# Patient Record
Sex: Female | Born: 1990 | Race: White | Hispanic: No | Smoking: Never smoker
Health system: Southern US, Community
[De-identification: ages and names within clinical notes are randomized; demographics above are authoritative.]

## PROBLEM LIST (undated history)

## (undated) DIAGNOSIS — J209 Acute bronchitis, unspecified: Secondary | ICD-10-CM

## (undated) DIAGNOSIS — F329 Major depressive disorder, single episode, unspecified: Secondary | ICD-10-CM

## (undated) DIAGNOSIS — F419 Anxiety disorder, unspecified: Secondary | ICD-10-CM

## (undated) DIAGNOSIS — F32A Depression, unspecified: Secondary | ICD-10-CM

## (undated) DIAGNOSIS — Z8489 Family history of other specified conditions: Secondary | ICD-10-CM

## (undated) DIAGNOSIS — Z189 Retained foreign body fragments, unspecified material: Secondary | ICD-10-CM

## (undated) DIAGNOSIS — K219 Gastro-esophageal reflux disease without esophagitis: Secondary | ICD-10-CM

## (undated) DIAGNOSIS — Z8041 Family history of malignant neoplasm of ovary: Secondary | ICD-10-CM

## (undated) DIAGNOSIS — R519 Headache, unspecified: Secondary | ICD-10-CM

## (undated) HISTORY — DX: Morbid (severe) obesity due to excess calories: E66.01

## (undated) HISTORY — DX: Family history of malignant neoplasm of ovary: Z80.41

## (undated) HISTORY — DX: Anxiety disorder, unspecified: F41.9

---
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
## 2011-07-11 ENCOUNTER — Emergency Department: Payer: Self-pay | Admitting: Emergency Medicine

## 2011-07-11 LAB — URINALYSIS, COMPLETE
Bacteria: NONE SEEN
Blood: NEGATIVE
Ketone: NEGATIVE
Nitrite: NEGATIVE
Ph: 5 (ref 4.5–8.0)
Protein: NEGATIVE
Specific Gravity: 1.016 (ref 1.003–1.030)
Squamous Epithelial: 6

## 2011-07-11 LAB — PREGNANCY, URINE: Pregnancy Test, Urine: NEGATIVE m[IU]/mL

## 2012-06-16 ENCOUNTER — Emergency Department: Payer: Self-pay | Admitting: Emergency Medicine

## 2012-06-16 LAB — PREGNANCY, URINE: Pregnancy Test, Urine: NEGATIVE m[IU]/mL

## 2012-06-16 LAB — CBC
MCH: 28.8 pg (ref 26.0–34.0)
MCHC: 33.9 g/dL (ref 32.0–36.0)
MCV: 85 fL (ref 80–100)
Platelet: 259 10*3/uL (ref 150–440)
RBC: 4.65 10*6/uL (ref 3.80–5.20)

## 2012-06-16 LAB — COMPREHENSIVE METABOLIC PANEL
Alkaline Phosphatase: 92 U/L (ref 50–136)
Creatinine: 0.78 mg/dL (ref 0.60–1.30)
EGFR (African American): >60
EGFR (Non-African Amer.): >60
Glucose: 89 mg/dL (ref 65–99)
Osmolality: 272 (ref 275–301)
Potassium: 3.7 mmol/L (ref 3.5–5.1)
SGOT(AST): 22 U/L (ref 15–37)
SGPT (ALT): 32 U/L (ref 12–78)
Sodium: 137 mmol/L (ref 136–145)
Total Protein: 8.2 g/dL (ref 6.4–8.2)

## 2012-06-16 LAB — URINALYSIS, COMPLETE
Bacteria: NONE SEEN
Bilirubin,UR: NEGATIVE
Ph: 6 (ref 4.5–8.0)
RBC,UR: 2 /HPF (ref 0–5)
Specific Gravity: 1.024 (ref 1.003–1.030)
Squamous Epithelial: 6

## 2012-06-16 LAB — LIPASE, BLOOD: Lipase: 161 U/L (ref 73–393)

## 2012-08-27 LAB — HM PAP SMEAR: HM PAP: NEGATIVE

## 2013-04-24 ENCOUNTER — Inpatient Hospital Stay: Payer: Self-pay | Admitting: Obstetrics and Gynecology

## 2013-04-24 LAB — CBC WITH DIFFERENTIAL/PLATELET
Basophil #: 0.1 10*3/uL (ref 0.0–0.1)
Basophil %: 0.2 %
EOS ABS: 0 10*3/uL (ref 0.0–0.7)
EOS PCT: 0.1 %
HCT: 39.1 % (ref 35.0–47.0)
HGB: 12.8 g/dL (ref 12.0–16.0)
LYMPHS ABS: 2.1 10*3/uL (ref 1.0–3.6)
Lymphocyte %: 9.3 %
MCH: 28 pg (ref 26.0–34.0)
MCHC: 32.7 g/dL (ref 32.0–36.0)
MCV: 86 fL (ref 80–100)
Monocyte #: 0.9 x10 3/mm (ref 0.2–0.9)
Monocyte %: 4 %
Neutrophil #: 19.2 10*3/uL — ABNORMAL HIGH (ref 1.4–6.5)
Neutrophil %: 86.4 %
Platelet: 277 10*3/uL (ref 150–440)
RBC: 4.57 10*6/uL (ref 3.80–5.20)
RDW: 14 % (ref 11.5–14.5)
WBC: 22.2 10*3/uL — AB (ref 3.6–11.0)

## 2013-04-25 LAB — HEMATOCRIT: HCT: 30.4 % — ABNORMAL LOW (ref 35.0–47.0)

## 2014-01-26 IMAGING — US ABDOMEN ULTRASOUND LIMITED
1 series · 14 of 25 positions shown · non-contrast
Comparison: none

REASON FOR EXAM: pain RUQ
COMMENTS:   Body Site: GB and Fossa, CBD, Head of Pancreas

PROCEDURE:     US  - US ABDOMEN LIMITED SURVEY  - June 17, 2012  [DATE]
RESULT:

[Series 1: abdomen ultrasound limited · 0.31mm/px · 14 of 52 slices shown]
[im 1/52]
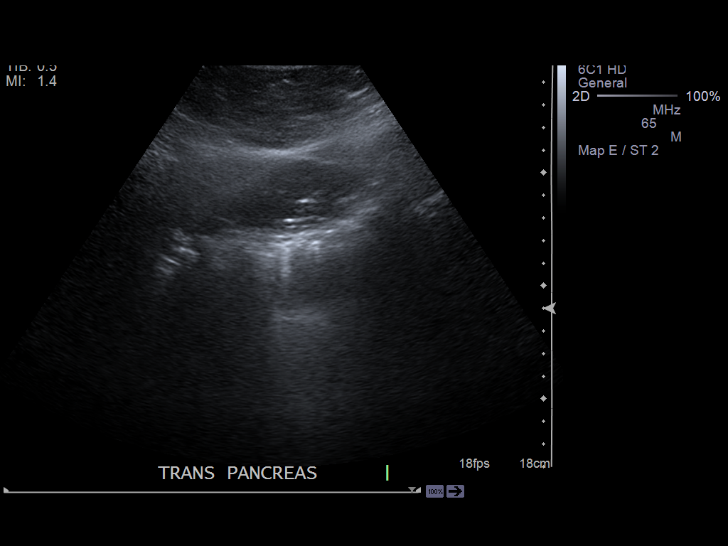
[im 5/52]
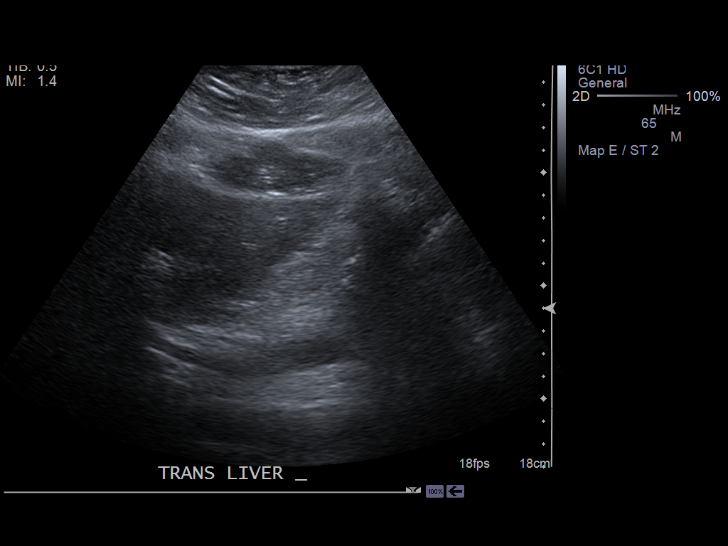
[im 9/52]
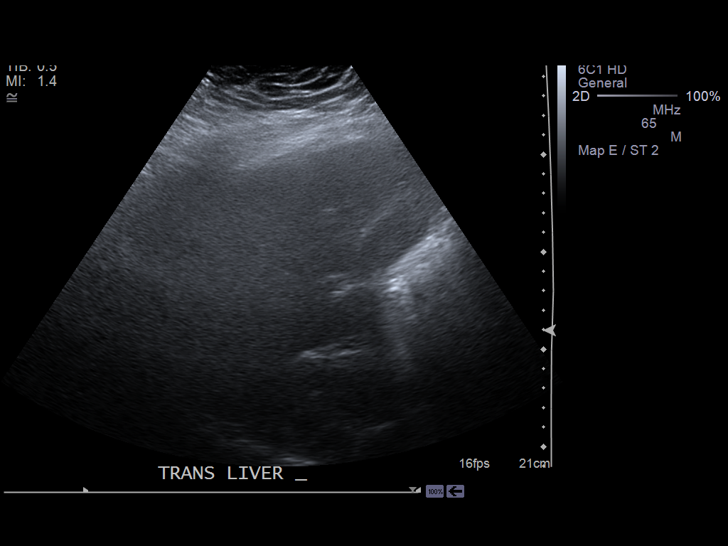
[im 13/52]
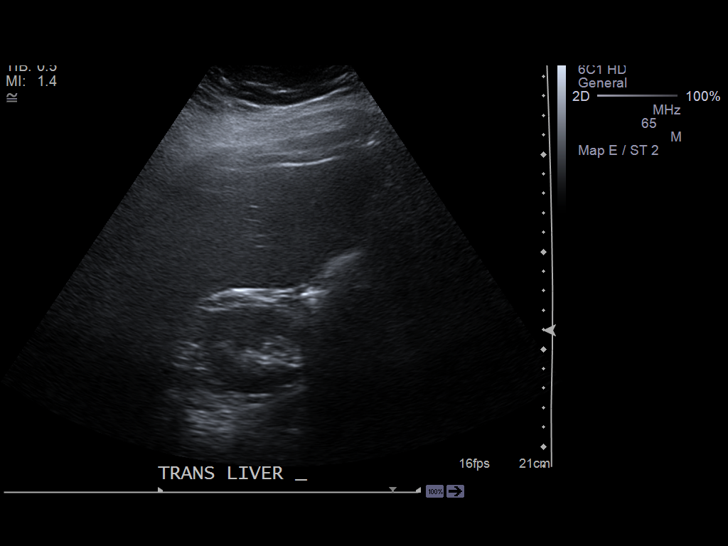
[im 18/52]
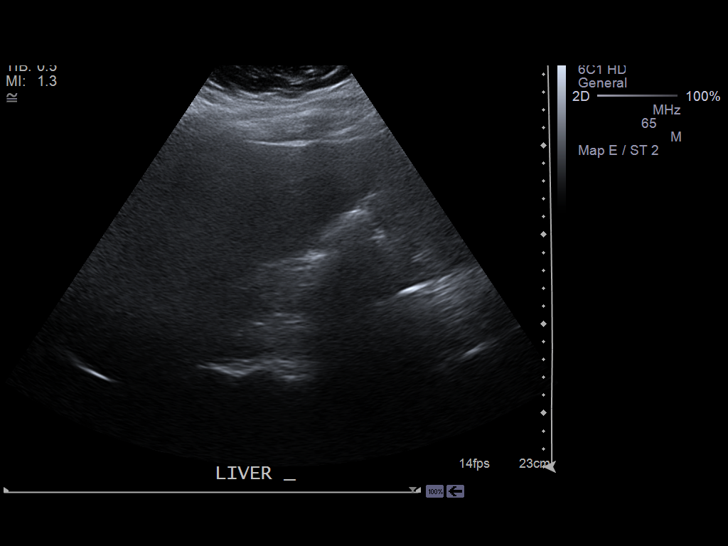
[im 20/52]
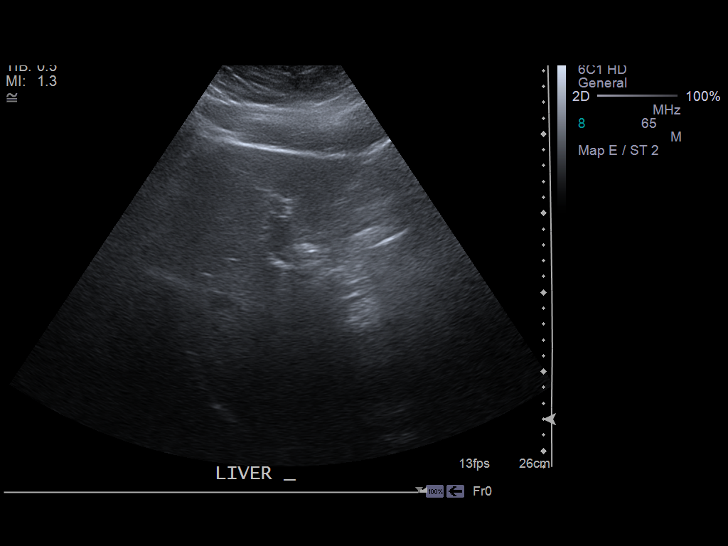
[im 24/52]
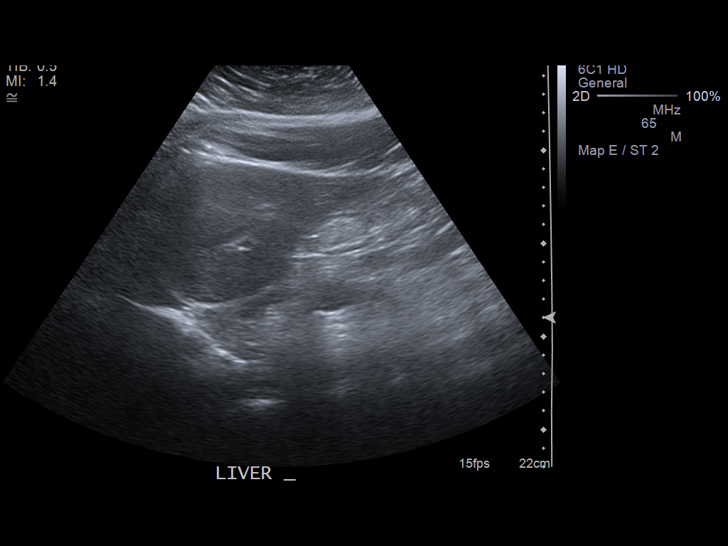
[im 28/52]
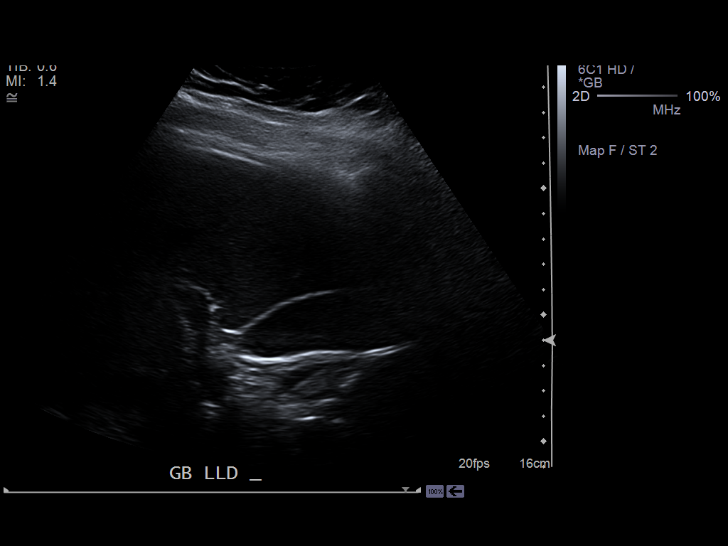
[im 32/52]
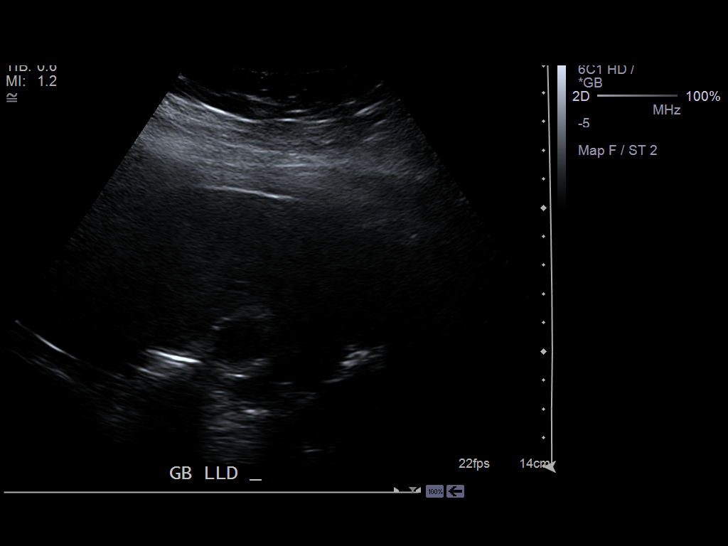
[im 35/52]
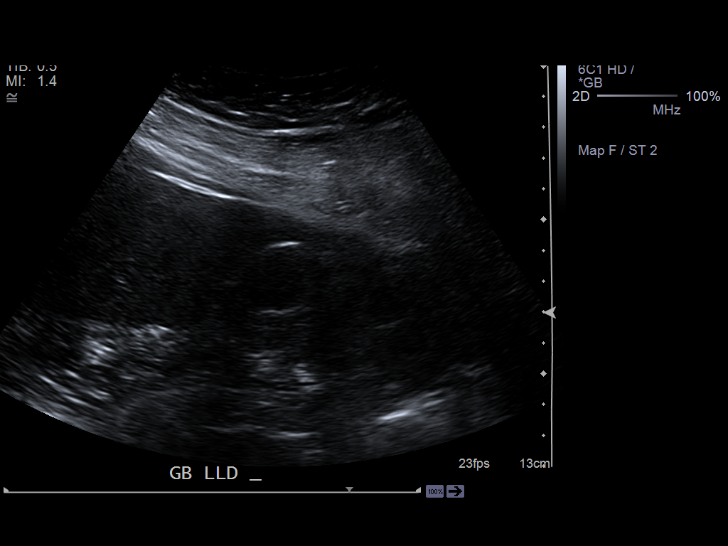
[im 39/52]
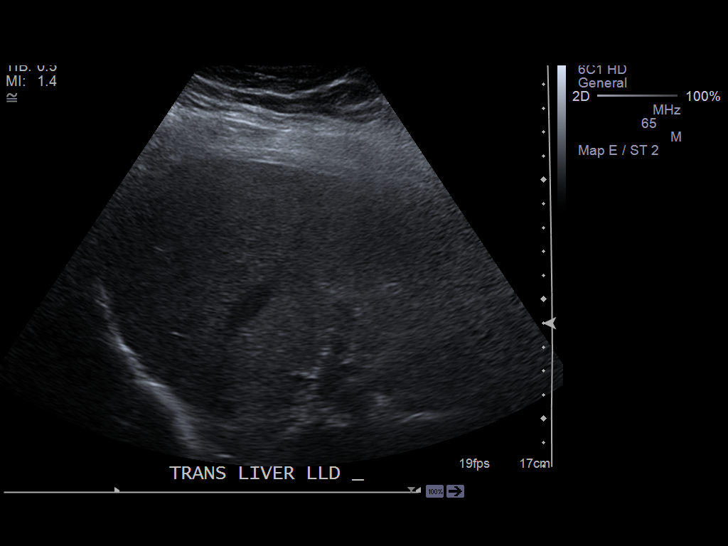
[im 43/52]
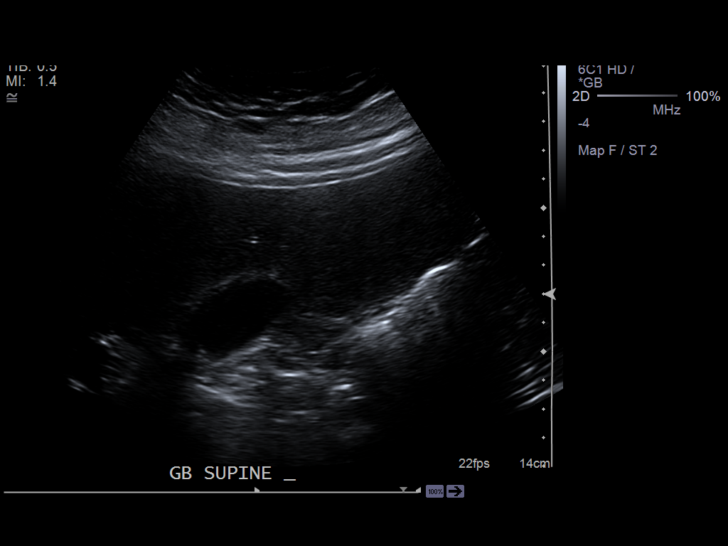
[im 47/52]
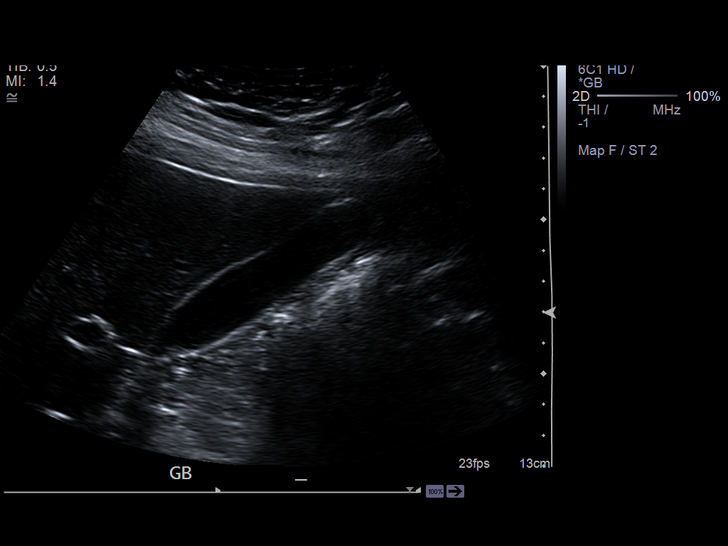
[im 52/52]
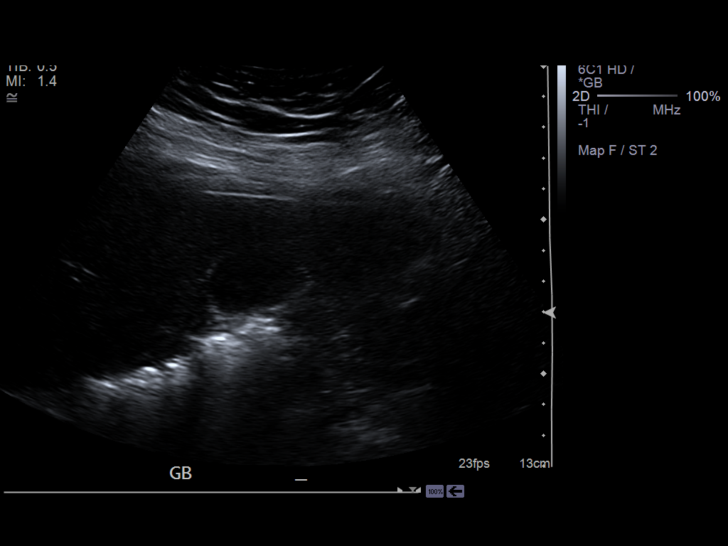

[14 of 25 positions shown; findings below may reference images not displayed]

FINDINGS: The liver is enlarged measuring 20.14 cm. There is no evidence of
intrahepatic or extrahepatic biliary ductal dilatation. Hepatobiliary flow
is identified within the portal vein. The pancreas is not visualized
secondary to a large amount of bowel gas.

There is no evidence of pericholecystic fluid, gallstones, sludging, or
sonographic Murphy's sign. Gallbladder wall thickness is 2 mm. The common
bile duct measures 2.7 mm in diameter.
IMPRESSION: 1.  Hepatomegaly.
2.  No sonographic evidence of cholecystitis
3.  The pancreas not visualized secondary to bowel gas.
4.  If there is persistent clinical concern, further evaluation with CT is
recommended.
5.  Dr. Ramel of the Emergency Department was informed of these findings
via a preliminary faxed report.

## 2014-01-26 IMAGING — CT CT ABD-PELV W/ CM
1 of 2 series · 15 of 32 positions shown, 19 images · non-contrast
Comparison: none

REASON FOR EXAM: (1) pain; (2) pain
COMMENTS:   May transport without cardiac monitor

PROCEDURE:     CT  - CT ABDOMEN / PELVIS  W  - June 17, 2012  [DATE]
RESULT:     Noncontrast CT abdomen and pelvis
TECHNIQUE: Helical noncontrasted 3 mm sections were obtained from the lung
bases through the pubic symphysis.

[Series 2: 3mm soft tissue · axial · 0.93mm/px · z∈[-1070,-564]mm · 15 of 187 slices shown, 19 images]
[im 9/187  soft-tissue]
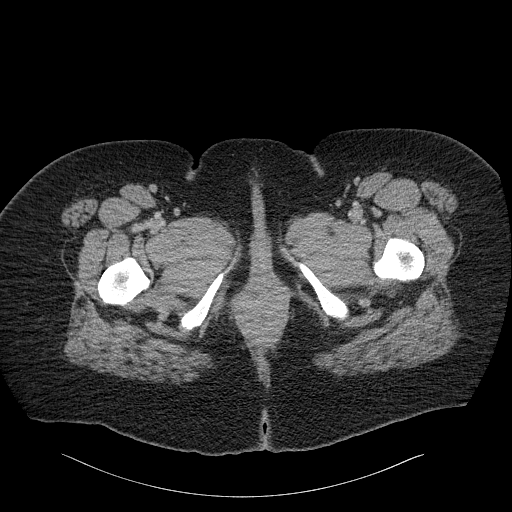
[im 9/187  bone]
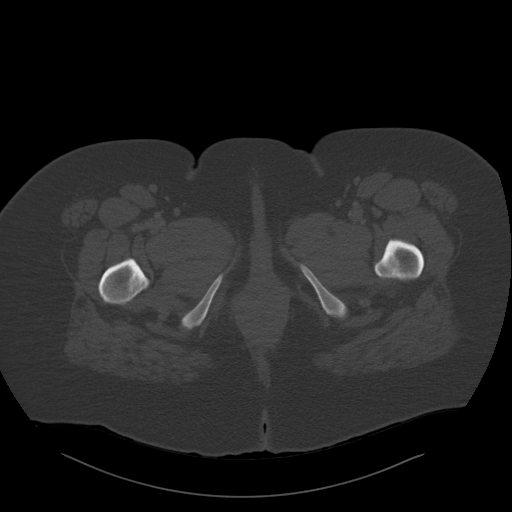
[im 25/187  soft-tissue]
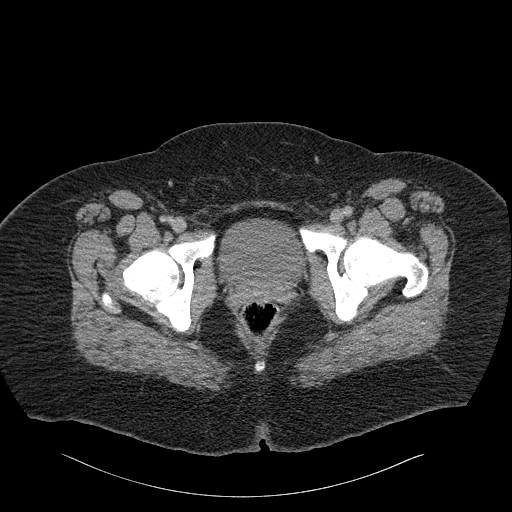
[im 41/187  soft-tissue]
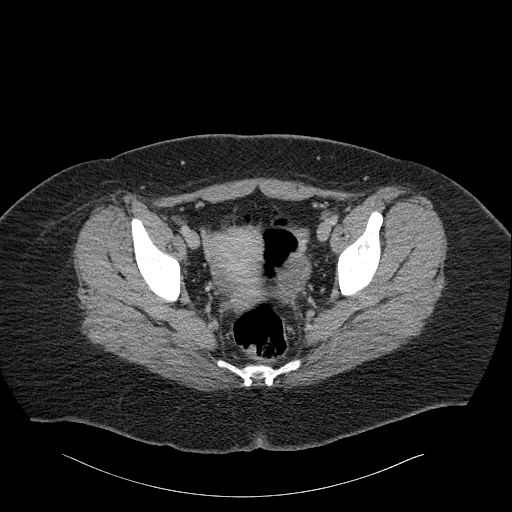
[im 49/187  soft-tissue]
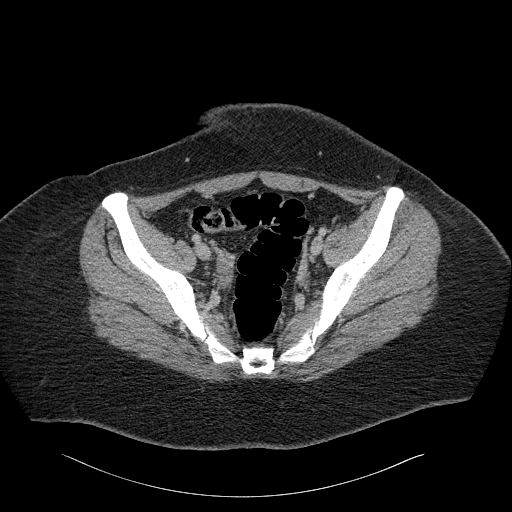
[im 65/187  soft-tissue]
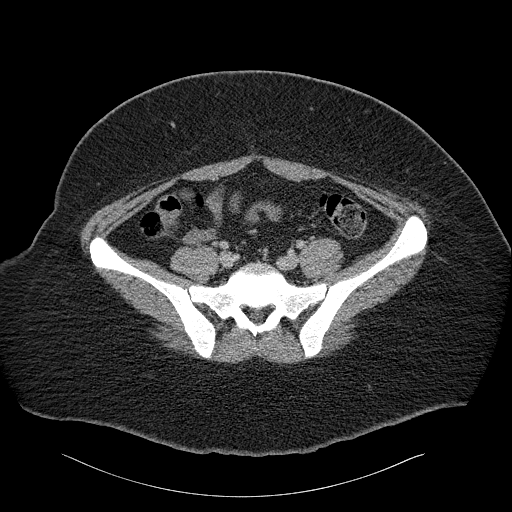
[im 81/187  soft-tissue]
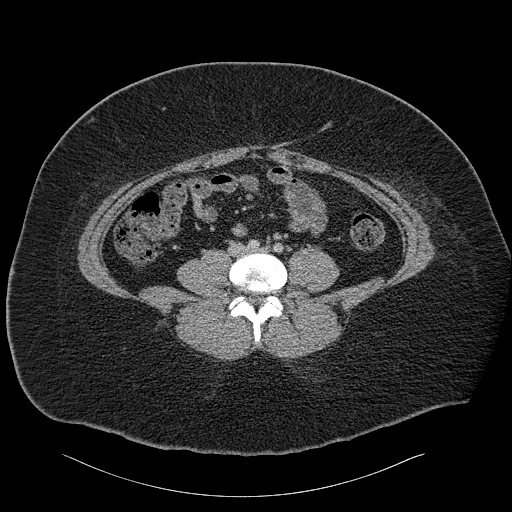
[im 98/187  soft-tissue]
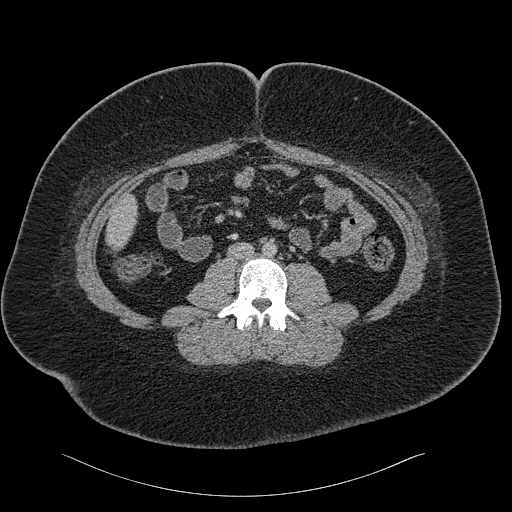
[im 106/187  soft-tissue]
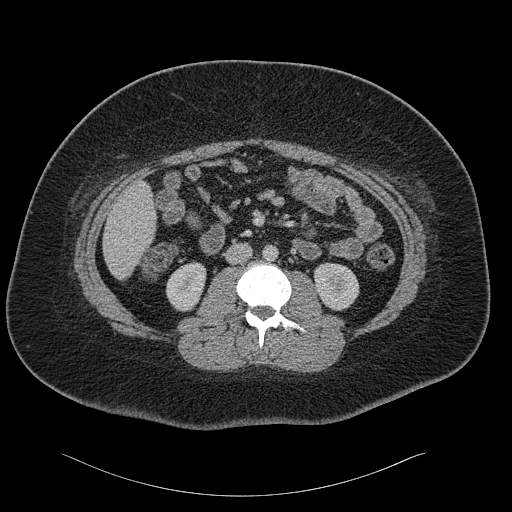
[im 122/187  soft-tissue]
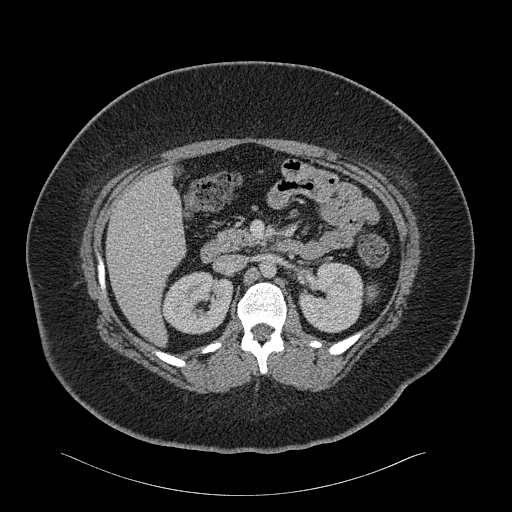
[im 122/187  bone]
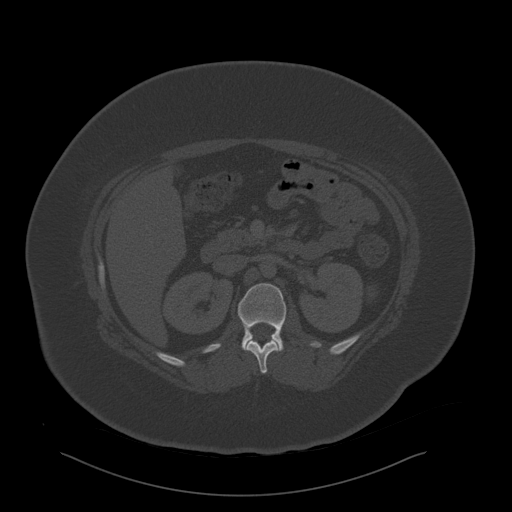
[im 138/187  soft-tissue]
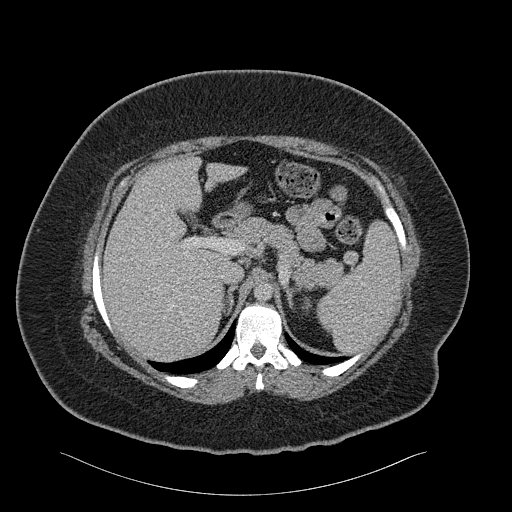
[im 146/187  soft-tissue]
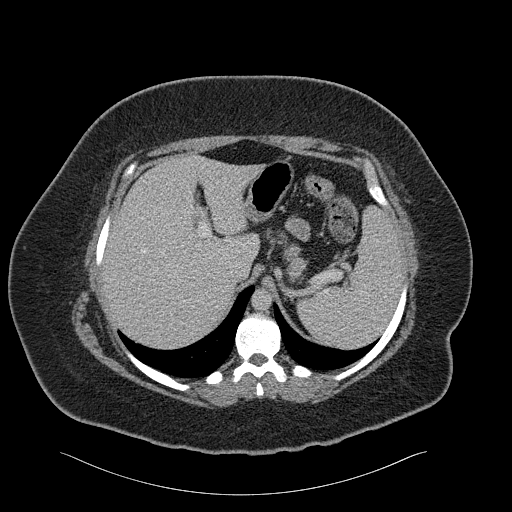
[im 154/187  lung]
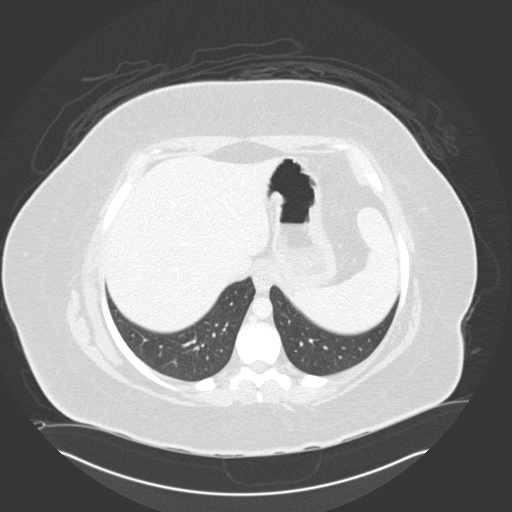
[im 162/187  soft-tissue]
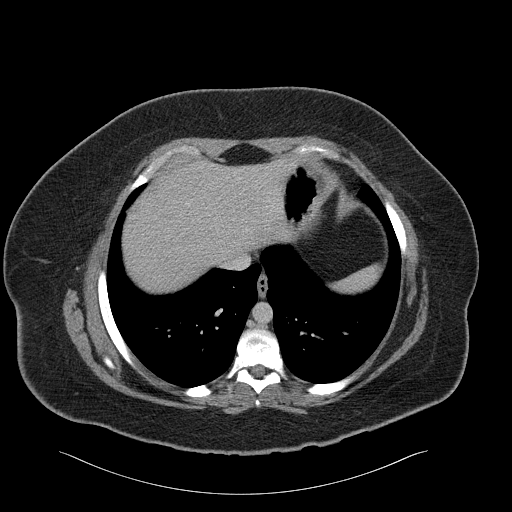
[im 162/187  lung]
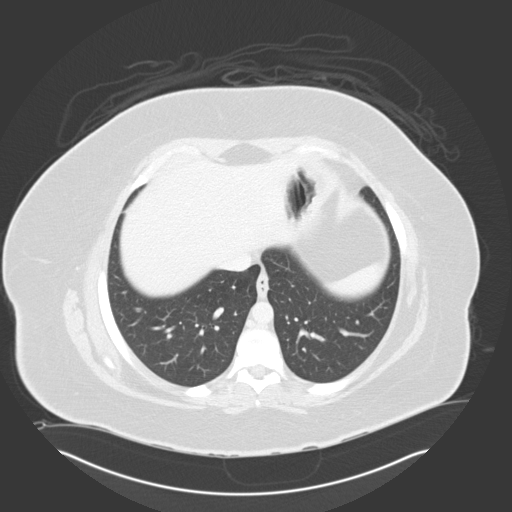
[im 170/187  lung]
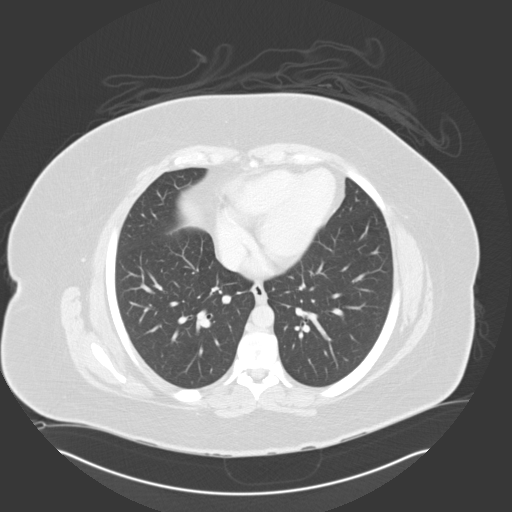
[im 178/187  soft-tissue]
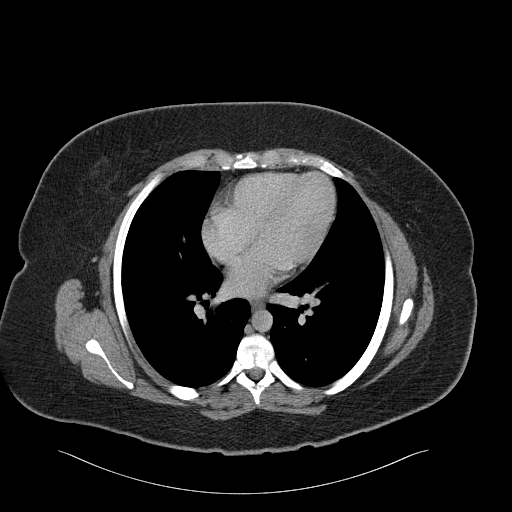
[im 178/187  lung]
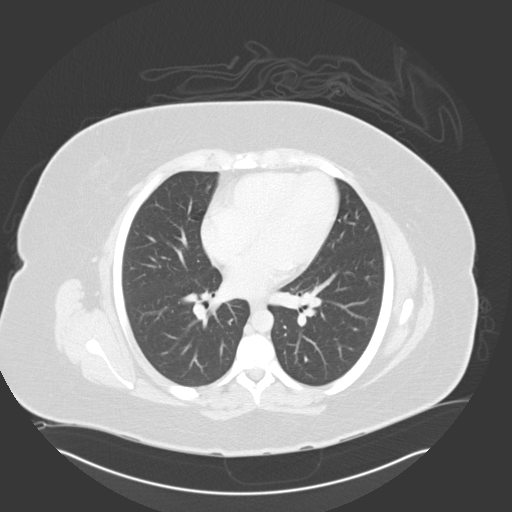

[15 of 32 positions shown; findings below may reference images not displayed]

FINDINGS: The lung bases are unremarkable.

Noncontrast evaluation of the liver, spleen, adrenals, pancreas, kidneys are
unremarkable. There is no CT evidence of bowel obstruction nor abdominal
aortic aneurysm. Within the limitations of a noncontrasted CT there is no
evidence of masses,free fluid, nor loculated fluid collections.

Prominent lymph nodes identified within the right lower quadrant.
IMPRESSION: 1. Prominent lymph nodes within the right lower quadrant which may represent
sequela of mesenteric adenitis. This is a diagnosis of exclusion. Clinical
correlation recommended.
2. Mild amount of fecal retention within the colon.
3. No further evidence of obstructive or inflammatory abnormalities.
4. Dr. Hasan M of the emergency department was informed of these findings via
a preliminary faxed report.

## 2014-06-01 NOTE — H&P (Signed)
L&D Evaluation:  History Expanded:  HPI 24 yo G1 at 35w1dby 6 week ultrasound whose pregnancy has been complicted by morbid obesity (BMI 46 at initial visit).  She presents with regular uterine contractions since yesterday evening.  She notes no vaginal bleeding, no leakage of fluid. She notes positive fetal movement.   Gravida 1   Blood Type (Maternal) B positive   Group B Strep Results Maternal (Result >5wks must be treated as unknown) negative  03/30/13   Maternal HIV Negative   Maternal Syphilis Ab Nonreactive   Maternal Varicella Non-Immune   Rubella Results (Maternal) nonimmune   [facility]   Patient's Medical History morbid obesity   Patient's Surgical History none   Medications Pre Natal Vitamins   Allergies NKDA   Social History none   Family History Non-Contributory   ROS:  ROS All systems were reviewed.  HEENT, CNS, GI, GU, Respiratory, CV, Renal and Musculoskeletal systems were found to be normal., unless noted in HPI   Exam:  Vital Signs T98.28F, P68, BP 118-148/74-78, RR 20   General no apparent distress, breathing through contractions   Mental Status clear   Chest clear   Heart normal sinus rhythm   Abdomen gravid, tender with contractions   Estimated Fetal Weight growth 86%ile at 36 weeks   Fetal Position cephalic   Back no CVAT   Edema no edema   Pelvic 5.5/100/-2 per RN   Mebranes Intact   FHT normal rate with no decels   FHT Description 120/mod var/+accels/no decels   Ucx 3-4 q 10 min   Skin no lesions   Impression:  Impression active labor, morbid obesity   Plan:  Comments 1) Labor: expectant management  2) Fetus - category I tracing  3) PNL B positive / ABSC negative / RNI - MMR postpartum / VZNI - Varicella vaccine postpartum / HIV neg / RPR NR / HBsAg neg / 1st trimester screen negative / 1-hr OGTT 86 / GBS negative  4) TDAP given 02/16/13  5) Disposition - home postpartum   Electronic  Signatures: JWill Bonnet(MD)  (Signed 03-Apr-15 07:00)  Authored: L&D Evaluation   Last Updated: 03-Apr-15 07:00 by JWill Bonnet(MD)

## 2016-07-27 ENCOUNTER — Encounter: Payer: Self-pay | Admitting: Advanced Practice Midwife

## 2016-07-31 ENCOUNTER — Ambulatory Visit (INDEPENDENT_AMBULATORY_CARE_PROVIDER_SITE_OTHER): Payer: Medicaid Other | Admitting: Advanced Practice Midwife

## 2016-07-31 ENCOUNTER — Encounter: Payer: Self-pay | Admitting: Advanced Practice Midwife

## 2016-07-31 VITALS — BP 110/76 | HR 69 | Ht 67.0 in | Wt 334.0 lb

## 2016-07-31 DIAGNOSIS — Z124 Encounter for screening for malignant neoplasm of cervix: Secondary | ICD-10-CM | POA: Diagnosis not present

## 2016-07-31 DIAGNOSIS — Z308 Encounter for other contraceptive management: Secondary | ICD-10-CM

## 2016-07-31 DIAGNOSIS — Z0000 Encounter for general adult medical examination without abnormal findings: Secondary | ICD-10-CM

## 2016-07-31 DIAGNOSIS — Z01419 Encounter for gynecological examination (general) (routine) without abnormal findings: Secondary | ICD-10-CM | POA: Diagnosis not present

## 2016-07-31 NOTE — Progress Notes (Signed)
Patient ID: Heather Irwin, female   DOB: 01/12/1991, 26 y.o.   MRN: [id number]     Gynecology Annual Exam  PCP: Patient, No Pcp Per  Chief Complaint:  Chief Complaint  Patient presents with  . Gynecologic Exam    Nexplanon removal    History of Present Illness: Patient is a 26 y.o. G1P1001 presents for annual exam. The patient has no complaints today. She requests removal of Nexplanon as she and her husband are planning a pregnancy.  LMP: No LMP recorded. Patient has had an implant. She has had some spotting in the past 2 months. Her Nexplanon was inserted June of 2015. Postcoital Bleeding: no Dysmenorrhea: no  The patient is sexually active. She currently uses Nexplanon for contraception. She denies dyspareunia.  The patient does occasionally perform self breast exams.  There is notable family history of breast or ovarian cancer in her family. Her maternal aunt had ovarian cancer and she is considering My Risk genetic screening.  The patient wears seatbelts: yes.   The patient has regular exercise: yes.  She admits being active with her 27 year old but denies cardio activity. She admits to needing to improve her diet.   The patient denies current symptoms of depression.    Review of Systems: Review of Systems  Constitutional: Negative.   HENT: Negative.   Eyes: Negative.   Respiratory: Negative.   Cardiovascular: Negative.   Gastrointestinal: Negative.   Genitourinary: Negative.   Musculoskeletal: Negative.   Skin: Negative.   Neurological: Negative.   Endo/Heme/Allergies: Negative.   Psychiatric/Behavioral: Negative.     Past Medical History:  Past Medical History:  Diagnosis Date  . Morbid obesity (HCC)     Past Surgical History:  No past surgical history on file.  Gynecologic History:  No LMP recorded. Patient has had an implant. Contraception: Nexplanon Last Pap: Results were: no abnormalities   Obstetric History: G1P1001  Family History:  No  family history on file.  Social History:  Social History   Social History  . Marital status: Married    Spouse name: N/A  . Number of children: N/A  . Years of education: N/A   Occupational History  . Not on file.   Social History Main Topics  . Smoking status: Never Smoker  . Smokeless tobacco: Never Used  . Alcohol use Yes  . Drug use: No  . Sexual activity: Yes    Partners: Male    Birth control/ protection: Implant   Other Topics Concern  . Not on file   Social History Narrative  . No narrative on file    Allergies:  No Known Allergies  Medications: Prior to Admission medications   Not on File    Physical Exam Vitals: Blood pressure 110/76, pulse 69, height 5\' 7"  (1.702 m), weight (!) 334 lb (151.5 kg).  General: NAD HEENT: normocephalic, anicteric Thyroid: no enlargement, no palpable nodules Pulmonary: No increased work of breathing, CTAB Cardiovascular: RRR, distal pulses 2+ Breast: Breast symmetrical, no tenderness, no palpable nodules or masses, no skin or nipple retraction present, no nipple discharge.  No axillary or supraclavicular lymphadenopathy. Abdomen: NABS, soft, non-tender, non-distended.  Umbilicus without lesions.  No hepatomegaly, splenomegaly or masses palpable. No evidence of hernia  Genitourinary:  External: Normal external female genitalia.  Normal urethral meatus, normal  Bartholin's and Skene's glands.    Vagina: Normal vaginal mucosa, no evidence of prolapse.    Cervix: Grossly unable to visualize due to body habitus, blind specimen  collected for PAP  Uterus: unable to palpate due to body habitus    Adnexa: unable to palpate due to body habitus  Rectal: deferred  Lymphatic: no evidence of inguinal lymphadenopathy Extremities: no edema, erythema, or tenderness Neurologic: Grossly intact Psychiatric: mood appropriate, affect full    Assessment: 26 y.o. G1P1001 Well woman exam with PAP smear  Plan: Problem List Items Addressed  This Visit    None    Visit Diagnoses    Well woman exam with routine gynecological exam    -  Primary      1) STI screening was offered and declined  2) ASCCP guidelines and rational discussed.  Patient opts for every 3 years screening interval  3) Contraception - patient plans pregnancy  4) Routine healthcare maintenance including cholesterol, diabetes screening discussed managed by PCP   5) Increase healthy lifestyle diet and exercise  6) Follow up 1 year for routine annual exam  [name], CNM       GYNECOLOGY PROCEDURE NOTE  Nexplanon removal discussed in detail.  Risks of infection, bleeding, nerve injury all reviewed.  Patient understands risks and desires to proceed.  Verbal consent obtained.  Patient is certain she wants the Nexplanon removed.  All questions answered.  Procedure: Patient placed in dorsal supine with left arm above head, elbow flexed at 90 degrees, arm resting on examination table.  Nexplanon identified without problems.  Betadine scrub x3.  1 ml of 1% lidocaine injected under Nexplanon device without problems.  Sterile gloves applied.  Small 0.5cm incision made at distal tip of Nexplanon device with 11 blade scalpel.  Nexplanon brought to incision and grasped with a small kelly clamp.  Nexplanon removed intact without problems.  Pressure applied to incision.  Hemostasis obtained.  Steri-strips applied, followed by bandage and compression dressing.  Patient tolerated procedure well.  No complications.   Assessment: 26 y.o. year old female now s/p uncomplicated Nexplanon removal.  Plan: 1.  Patient given post procedure precautions and asked to call for fever, chills, redness or drainage from her incision, bleeding from incision.  She understands she will likely have a small bruise near site of removal and can remove bandage tomorrow and steri-strips in approximately 1 week.  2) Contraception: plans pregnancy  Tresea MallJane [name], CNM

## 2016-07-31 NOTE — Addendum Note (Signed)
Addended by: Tresea MallGLEDHILL, Jerel Sardina on: 07/31/2016 02:08 PM   Modules accepted: Orders

## 2016-08-01 LAB — IGP, RFX APTIMA HPV ASCU: PAP Smear Comment: 0

## 2016-10-12 ENCOUNTER — Emergency Department
Admission: EM | Admit: 2016-10-12 | Discharge: 2016-10-12 | Disposition: A | Discharge status: Home / Self Care | Payer: Self-pay | Attending: Emergency Medicine | Admitting: Emergency Medicine

## 2016-10-12 ENCOUNTER — Encounter: Payer: Self-pay | Admitting: Emergency Medicine

## 2016-10-12 DIAGNOSIS — J039 Acute tonsillitis, unspecified: Secondary | ICD-10-CM | POA: Insufficient documentation

## 2016-10-12 LAB — POCT RAPID STREP A: STREPTOCOCCUS, GROUP A SCREEN (DIRECT): NEGATIVE

## 2016-10-12 MED ORDER — MAGIC MOUTHWASH W/LIDOCAINE: 5.0000 mL | Freq: Four times a day (QID) | ORAL | 0 refills | Status: DC

## 2016-10-12 MED ORDER — LIDOCAINE VISCOUS 2 % MT SOLN
15.0000 mL | Freq: Once | OROMUCOSAL | Status: AC
  Administered 2016-10-12: 15 mL via OROMUCOSAL
  Filled 2016-10-12: qty 15

## 2016-10-12 MED ORDER — DEXAMETHASONE SODIUM PHOSPHATE 10 MG/ML IJ SOLN
10.0000 mg | Freq: Once | INTRAMUSCULAR | Status: AC
  Administered 2016-10-12: 10 mg via INTRAMUSCULAR
  Filled 2016-10-12: qty 1

## 2016-10-12 MED ORDER — DIPHENHYDRAMINE HCL 12.5 MG/5ML PO ELIX
25.0000 mg | ORAL_SOLUTION | Freq: Once | ORAL | Status: AC
  Administered 2016-10-12: 25 mg via ORAL
  Filled 2016-10-12: qty 10

## 2016-10-12 MED ORDER — AMOXICILLIN 500 MG PO CAPS: 500.0000 mg | ORAL_CAPSULE | Freq: Three times a day (TID) | ORAL | 0 refills | Status: DC

## 2016-10-12 NOTE — ED Provider Notes (Signed)
[facility] Emergency Department Provider Note   ____________________________________________   First MD Initiated Contact with Patient 10/12/16 1122     (approximate)  I have reviewed the triage vital signs and the nursing notes.   HISTORY  Chief Complaint Sore Throat    HPI Heather Irwin is a 26 y.o. female patient is sore throat with tonsillar swelling for 2 days. Patient believes fever with this complaint but unable to measure temperature. Patient stated mild pain with swallowing but can tolerate food and fluids. Patient rates her pain discomfort as a 5/10. No palliative measures for complaint.  Past Medical History:  Diagnosis Date  . Morbid obesity (HCC)     There are no active problems to display for this patient.   History reviewed. No pertinent surgical history.  Prior to Admission medications   Medication Sig Start Date End Date Taking? Authorizing Provider  amoxicillin (AMOXIL) 500 MG capsule Take 1 capsule (500 mg total) by mouth 3 (three) times daily. 10/12/16   Joni Reining, PA-C  magic mouthwash w/lidocaine SOLN Take 5 mLs by mouth 4 (four) times daily. Oral swish and swallow 4 times a day. 10/12/16   Joni Reining, PA-C    Allergies Patient has no known allergies.  No family history on file.  Social History Social History  Substance Use Topics  . Smoking status: Never Smoker  . Smokeless tobacco: Never Used  . Alcohol use Yes    Review of Systems  Constitutional: No fever/chills Eyes: No visual changes. ENT: Sore throat and swollen tonsils. Cardiovascular: Denies chest pain. Respiratory: Denies shortness of breath. Gastrointestinal: No abdominal pain.  No nausea, no vomiting.  No diarrhea.  No constipation. Genitourinary: Negative for dysuria. Musculoskeletal: Negative for back pain. Skin: Negative for rash. Neurological: Negative for headaches, focal weakness or  numbness.   ____________________________________________   PHYSICAL EXAM:  VITAL SIGNS: ED Triage Vitals  Enc Vitals Group     BP 10/12/16 1051 127/77     Pulse Rate 10/12/16 1051 86     Resp 10/12/16 1051 16     Temp 10/12/16 1051 99.2 F (37.3 C)     Temp Source 10/12/16 1051 Oral     SpO2 10/12/16 1051 97 %     Weight 10/12/16 1051 (!) 330 lb (149.7 kg)     Height --      Head Circumference --      Peak Flow --      Pain Score 10/12/16 1045 5     Pain Loc --      Pain Edu? --      Excl. in GC? --     Constitutional: Alert and oriented. Well appearing and in no acute distress. Nose: No congestion/rhinnorhea. Mouth/Throat: Mucous membranes are moist.  Oropharynx erythematous With edematous exudative tonsils. Neck: No stridor.  Hematological/Lymphatic/Immunilogical: Bilateral cervical lymphadenopathy. Cardiovascular: Normal rate, regular rhythm. Grossly normal heart sounds.  Good peripheral circulation. Respiratory: Normal respiratory effort.  No retractions. Lungs CTAB. Neurologic:  Normal speech and language. No gross focal neurologic deficits are appreciated. No gait instability. Skin:  Skin is warm, dry and intact. No rash noted. Psychiatric: Mood and affect are normal. Speech and behavior are normal.  ____________________________________________   LABS (all labs ordered are listed, but only abnormal results are displayed)  Labs Reviewed  CULTURE, GROUP A STREP [facility])  POCT RAPID STREP A   ____________________________________________  EKG   ____________________________________________  RADIOLOGY  No results found.  ____________________________________________  PROCEDURES  Procedure(s) performed: None  Procedures  Critical Care performed: No  ____________________________________________   INITIAL IMPRESSION / ASSESSMENT AND PLAN / ED COURSE  Pertinent labs & imaging results that were available during my care of the patient were reviewed  by me and considered in my medical decision making (see chart for details).  Sore throat secondary to exudative and edematous tonsils. Advised patient throat culture pending. Patient given discharge care instructions advised take medication as directed. Advised patient to follow-up with the "clinic if condition persists.      ____________________________________________   FINAL CLINICAL IMPRESSION(S) / ED DIAGNOSES  Final diagnoses:  Tonsillitis      NEW MEDICATIONS STARTED DURING THIS VISIT:  New Prescriptions   AMOXICILLIN (AMOXIL) 500 MG CAPSULE    Take 1 capsule (500 mg total) by mouth 3 (three) times daily.   MAGIC MOUTHWASH W/LIDOCAINE SOLN    Take 5 mLs by mouth 4 (four) times daily. Oral swish and swallow 4 times a day.     Note:  This document was prepared using Dragon voice recognition software and may include unintentional dictation errors.    Joni Reining, PA-C 10/12/16 1138    Phineas Semen, MD 10/12/16 [phone]

## 2016-10-12 NOTE — ED Triage Notes (Signed)
Sore throat with swelling tonsinls for 2 days.  Thinks she may have had fever yesterday and took thereflu

## 2016-10-12 NOTE — ED Notes (Signed)
Pt alert and oriented X4, active, cooperative, pt in NAD. RR even and unlabored, color WNL.  Pt informed to return if any life threatening symptoms occur.  Discharge and followup instructions reviewed.  

## 2016-10-13 LAB — CULTURE, GROUP A STREP (THRC)

## 2016-12-06 ENCOUNTER — Telehealth: Payer: Self-pay | Admitting: Obstetrics and Gynecology

## 2016-12-06 NOTE — Telephone Encounter (Signed)
Patient scheduled 12/10 for nexplanon insertion.  She had annual and removal of nexplanon 07/2016.

## 2016-12-31 ENCOUNTER — Ambulatory Visit: Payer: Self-pay | Admitting: Obstetrics and Gynecology

## 2017-01-02 ENCOUNTER — Telehealth: Payer: Self-pay | Admitting: Obstetrics and Gynecology

## 2017-01-02 NOTE — Telephone Encounter (Signed)
Pt has reschedule to 01/03/17 with Erskine SquibbJane at 1:50 for nexplanon insertion

## 2017-01-02 NOTE — Telephone Encounter (Signed)
Pt called the after hour nurse and is requesting an Appointment. I called and left voicmail for patient to call back to be schedule

## 2017-01-03 ENCOUNTER — Ambulatory Visit: Payer: Self-pay | Admitting: Advanced Practice Midwife

## 2017-01-03 NOTE — Telephone Encounter (Signed)
Pt no showed 01/03/17. Pt has r/s to 01/04/17 with Erskine SquibbJane for nexplanon insertion

## 2017-01-04 ENCOUNTER — Encounter: Payer: Self-pay | Admitting: Advanced Practice Midwife

## 2017-01-04 ENCOUNTER — Ambulatory Visit: Payer: Medicaid Other | Admitting: Advanced Practice Midwife

## 2017-01-04 VITALS — BP 112/78 | HR 68 | Ht 68.0 in | Wt 316.0 lb

## 2017-01-04 DIAGNOSIS — Z309 Encounter for contraceptive management, unspecified: Secondary | ICD-10-CM

## 2017-01-04 DIAGNOSIS — Z30017 Encounter for initial prescription of implantable subdermal contraceptive: Secondary | ICD-10-CM

## 2017-01-04 NOTE — Progress Notes (Signed)
    GYNECOLOGY PROCEDURE NOTE  Patient is a 26 y.o. G1P1001 presenting for Nexplanon insertion as her desires means of contraception.  She provided informed consent, signed copy in the chart, time out was performed. Self reported LMP of Patient's last menstrual period was 01/01/2017.  She understands that Nexplanon is a progesterone only therapy, and that patients often patients have irregular and unpredictable vaginal bleeding or amenorrhea. She understands that other side effects are possible related to systemic progesterone, including but not limited to, headaches, breast tenderness, nausea, and irritability. While effective at preventing pregnancy long acting reversible contraceptives do not prevent transmission of sexually transmitted diseases and use of barrier methods for this purpose was discussed. The placement procedure for Nexplanon was reviewed with the patient in detail including risks of nerve injury, infection, bleeding and injury to other muscles or tendons. She understands that the Nexplanon implant is good for 3 years and needs to be removed at the end of that time.  She understands that Nexplanon is an extremely effective option for contraception, with failure rate of <1%. This information is reviewed today and all questions were answered. Informed consent was obtained, both verbally and written.   The patient is healthy and has no contraindications to Nexplanon use.   Procedure Appropriate time out taken.  Patient placed in dorsal supine with left arm above head, elbow flexed at 90 degrees, arm resting on examination table.  Prior Nexplanon site identified . The insertion site was prepped with a two betadine swabs and then injected with 2 ml of 1% lidocaine without epinephrine.  Nexplanon removed form sterile blister packaging,  Device confirmed in needle, before inserting full length of needle, tenting up the skin as the needle was advanced.  The drug eluding rod was then deployed by  pulling back the slider per the manufactures recommendation.  The implant was palpable by the clinician as well as the patient.  The insertion site covered dressed with a band aid before applying  a kerlex bandage pressure dressing..Minimal blood loss was noted during the procedure.  The patient tolerated the procedure well.   She was instructed to wear the bandage for 24 hours, call with any signs of infection.  She was given the Nexplanon card and instructed to have the rod removed in 3 years.  Heather MallJane Surabhi Irwin, CNM   Charge [phone] for nexplanon device, CPT [phone] for procedure J2001 for lidocaine administration Modifer 25, plus Modifer 79 is done during a global billing visit

## 2017-01-04 NOTE — Telephone Encounter (Signed)
Nexplanon stock available for this patient.

## 2017-03-06 NOTE — Telephone Encounter (Signed)
Per Epic scheduling/Encounters. Pt received Nexplanon 01/04/17

## 2018-11-13 ENCOUNTER — Encounter: Payer: Self-pay | Admitting: Advanced Practice Midwife

## 2018-11-13 ENCOUNTER — Telehealth: Payer: Self-pay | Admitting: Advanced Practice Midwife

## 2018-11-13 ENCOUNTER — Other Ambulatory Visit: Payer: Self-pay

## 2018-11-13 ENCOUNTER — Ambulatory Visit (INDEPENDENT_AMBULATORY_CARE_PROVIDER_SITE_OTHER): Payer: Medicaid Other | Admitting: Advanced Practice Midwife

## 2018-11-13 VITALS — BP 120/72 | HR 65 | Ht 68.5 in | Wt 335.0 lb

## 2018-11-13 DIAGNOSIS — Z01419 Encounter for gynecological examination (general) (routine) without abnormal findings: Secondary | ICD-10-CM

## 2018-11-13 DIAGNOSIS — Z3049 Encounter for surveillance of other contraceptives: Secondary | ICD-10-CM | POA: Diagnosis not present

## 2018-11-13 DIAGNOSIS — Z0000 Encounter for general adult medical examination without abnormal findings: Secondary | ICD-10-CM

## 2018-11-13 DIAGNOSIS — T859XXA Unspecified complication of internal prosthetic device, implant and graft, initial encounter: Secondary | ICD-10-CM

## 2018-11-13 NOTE — Telephone Encounter (Signed)
Patient is aware to report to [facility] for xray

## 2018-11-13 NOTE — Telephone Encounter (Signed)
-----   Message from [facility] sent at 11/13/2018  1:42 PM EDT ----- Please let the patient know she can have the xray anytime. I believe they do those on a walk-in basis. You may want to confirm w/ Centralized Scheduling. Thanks. ----- Message ----- From: Rod Can, CNM Sent: 11/13/2018  11:22 AM EDT To: [facility]  I tried to remove Nexplanon today for this patient but was unable to get it out. I put an order in for her to have an outpatient x-ray in the next week so we can locate her Nexplanon. I'm not sure how that gets coordinated. She also has a visit scheduled with Dr Kenton Kingfisher on 11/3 to attempt removal again.

## 2018-11-13 NOTE — Progress Notes (Signed)
Gynecology Annual Exam   PCP: Patient, No Pcp Per  Chief Complaint:  Chief Complaint  Patient presents with  . nexplanon removal    History of Present Illness: Patient is a 28 y.o. G1P1001 presents for annual exam. The patient has complaint today of 2 episodes of irregular bleeding in the past 3 months. Since having her second Nexplanon placed 2 years ago she has not had any bleeding. In the past 3 months she has experienced 2 days of bright red and heavy bleeding that lasted for 2 days. She decided that she wants the Nexplanon removed and to have a hormone free interval.    LMP: No LMP recorded. Patient has had an implant.  Postcoital Bleeding: not applicable Dysmenorrhea: not applicable  The patient is not currently sexually active. She currently uses Nexplanon for contraception. She denies dyspareunia.  The patient does perform self breast exams.  There is notable family history of breast or ovarian cancer in her family. Her maternal aunt had ovarian cancer. MyRisk offered again this year. Patient is considering.   The patient wears seatbelts: yes.   The patient has regular exercise: She admits very little physical activity. 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 Otherwise she is not physically active. She does not follow healthy lifestyle diet, hydration or sleep. She has no support from her husband who she is separated from and takes care of her son by herself. She does have other supportive friends and family.    The patient denies current symptoms of depression.    Review of Systems: Review of Systems  Constitutional: Negative.   HENT: Negative.   Eyes: Negative.   Respiratory: Negative.   Cardiovascular: Negative.   Gastrointestinal: Negative.   Genitourinary:       Irregular bleeding  Musculoskeletal: Negative.   Skin: Negative.   Neurological: Negative.   Endo/Heme/Allergies: Negative.   Psychiatric/Behavioral: Negative.     Past Medical History:   Past Medical History:  Diagnosis Date  . Morbid obesity (HCC)     Past Surgical History:  History reviewed. No pertinent surgical history.  Gynecologic History:  No LMP recorded. Patient has had an implant. Contraception: Nexplanon Last Pap:2 years ago Results were: no abnormalities   Obstetric History: G1P1001  Family History:  History reviewed. No pertinent family history.  Social History:  Social History   Socioeconomic History  . Marital status: Married    Spouse name: Not on file  . Number of children: Not on file  . Years of education: Not on file  . Highest education level: Not on file  Occupational History  . Not on file  Social Needs  . Financial resource strain: Not on file  . Food insecurity    Worry: Not on file    Inability: Not on file  . Transportation needs    Medical: Not on file    Non-medical: Not on file  Tobacco Use  . Smoking status: Never Smoker  . Smokeless tobacco: Never Used  Substance and Sexual Activity  . Alcohol use: Yes  . Drug use: No  . Sexual activity: Yes    Partners: Male    Birth control/protection: Implant  Lifestyle  . Physical activity    Days per week: Not on file    Minutes per session: Not on file  . Stress: Not on file  Relationships  . Social Musicianconnections    Talks on phone: Not on file    Gets together: Not on  file    Attends religious service: Not on file    Active member of club or organization: Not on file    Attends meetings of clubs or organizations: Not on file    Relationship status: Not on file  . Intimate partner violence    Fear of current or ex partner: Not on file    Emotionally abused: Not on file    Physically abused: Not on file    Forced sexual activity: Not on file  Other Topics Concern  . Not on file  Social History Narrative  . Not on file    Allergies:  No Known Allergies  Medications: Prior to Admission medications   Not on File    Physical Exam Vitals: Blood pressure  120/72, pulse 65, height 5' 8.5" (1.74 m), weight (!) 335 lb (152 kg).  General: NAD HEENT: normocephalic, anicteric Thyroid: no enlargement, no palpable nodules Pulmonary: No increased work of breathing, CTAB Cardiovascular: RRR, distal pulses 2+ Breast: Breast symmetrical, no tenderness, no palpable nodules or masses, no skin or nipple retraction present, no nipple discharge.  No axillary or supraclavicular lymphadenopathy. Abdomen: NABS, soft, non-tender, non-distended.  Umbilicus without lesions.  No hepatomegaly, splenomegaly or masses palpable. No evidence of hernia  Genitourinary: deferred for no concerns/PAP interval Extremities: no edema, erythema, or tenderness Neurologic: Grossly intact Psychiatric: mood appropriate, affect full    Assessment: 27 y.o. G1P1001 routine annual exam  Plan: Problem List Items Addressed This Visit    None    Visit Diagnoses    Well woman exam without gynecological exam    -  Primary   Complication due to device, implant, or graft, initial encounter       Relevant Orders   DG Humerus Left      1) STI screening  was offered and declined  2)  ASCCP guidelines and rationale discussed.  Patient opts for every 3 years screening interval  3) Contraception - the patient is currently using  Nexplanon.  She is interested in changing to no hormones while not sexually active. She plans condoms when she resumes sexual activity  4) Routine healthcare maintenance including cholesterol, diabetes screening discussed Declines  5) Return in about 1 week (around 11/20/2018) for MD visit [place]- nexplanon removal.   Tresea Mall, CNM [facility] 11/13/2018, 11:53 AM     GYNECOLOGY PROCEDURE NOTE  Implanon removal discussed in detail.  Risks of infection, bleeding, nerve injury all reviewed.  Patient understands risks and desires to proceed.  Verbal consent obtained.  Patient is certain she wants the implanon  removed.  All questions answered.  Procedure: Patient placed in dorsal supine with left arm above head, elbow flexed at 90 degrees, arm resting on examination table.  Nexplanon was palpated deep at site of insertion. 2 ml of 1% Lidocaine was administered at site. Area prepped with betadine swabs x 2. 5 mm incision was made at site of insertion. Attempts made to locate Nexplanon were unsuccessful. Patient tolerated procedure well. We discussed next steps for follow up.  Pressure applied to incision.  Hemostasis obtained.  Steri-strips applied, followed by bandage and compression dressing.     Assessment: 28 y.o. year old female attempted removal of Nexplanon. Unable to complete procedure  Plan: 1)  Patient given post procedure precautions and asked to call for fever, chills, redness or drainage from her incision, bleeding from incision.  She understands she will likely have a small bruise near site of removal and can  remove bandage tomorrow and steri-strips in approximately 1 week.  2) Contraception: plans condoms when she becomes sexually active  3) Outpatient x-ray to locate Nexplanon  4) MD follow up in [facility] for removal of Nexplanon  [id number] for lidocaine block, [id number] for nexplanon removal

## 2018-11-14 ENCOUNTER — Other Ambulatory Visit: Payer: Self-pay

## 2018-11-14 NOTE — Telephone Encounter (Signed)
I put the order in yesterday. Not sure what the problem is.

## 2018-11-14 NOTE — Telephone Encounter (Signed)
Patient is calling about her xray. Patient tried going yesterday to have Xray done. Patient states they told her there was no order place. Would you please place xray order?

## 2018-11-14 NOTE — Telephone Encounter (Signed)
Patient is  Going today around 2 to the out patient imaging

## 2018-11-17 ENCOUNTER — Ambulatory Visit
Admission: RE | Admit: 2018-11-17 | Discharge: 2018-11-17 | Disposition: A | Discharge status: Home / Self Care | Payer: Medicaid Other | Attending: Advanced Practice Midwife | Admitting: Advanced Practice Midwife

## 2018-11-17 ENCOUNTER — Ambulatory Visit
Admission: RE | Admit: 2018-11-17 | Discharge: 2018-11-17 | Disposition: A | Discharge status: Home / Self Care | Payer: Medicaid Other | Source: Ambulatory Visit | Attending: Advanced Practice Midwife | Admitting: Advanced Practice Midwife

## 2018-11-17 ENCOUNTER — Other Ambulatory Visit: Payer: Self-pay

## 2018-11-17 DIAGNOSIS — T859XXA Unspecified complication of internal prosthetic device, implant and graft, initial encounter: Secondary | ICD-10-CM | POA: Insufficient documentation

## 2018-11-25 ENCOUNTER — Ambulatory Visit: Payer: Medicaid Other | Admitting: Obstetrics & Gynecology

## 2018-12-01 ENCOUNTER — Encounter: Payer: Self-pay | Admitting: Obstetrics and Gynecology

## 2018-12-01 ENCOUNTER — Other Ambulatory Visit: Payer: Self-pay

## 2018-12-01 ENCOUNTER — Ambulatory Visit (INDEPENDENT_AMBULATORY_CARE_PROVIDER_SITE_OTHER): Payer: Medicaid Other | Admitting: Obstetrics and Gynecology

## 2018-12-01 VITALS — HR 74 | Ht 69.0 in | Wt 334.0 lb

## 2018-12-01 DIAGNOSIS — Z3049 Encounter for surveillance of other contraceptives: Secondary | ICD-10-CM

## 2018-12-01 DIAGNOSIS — Z538 Procedure and treatment not carried out for other reasons: Secondary | ICD-10-CM

## 2018-12-01 DIAGNOSIS — Z975 Presence of (intrauterine) contraceptive device: Secondary | ICD-10-CM

## 2018-12-01 DIAGNOSIS — Z3046 Encounter for surveillance of implantable subdermal contraceptive: Secondary | ICD-10-CM

## 2018-12-01 NOTE — Progress Notes (Signed)
  Nexplanon removal- FAILED Procedure note - The Nexplanon was noted in the patient's arm and the end was identified. The skin was cleansed with a Betadine solution. A small injection of subcutaneous lidocaine with epinephrine was given over the end of the implant. An incision was made at the end of the implant. The rod could not be found despite 30 minutes of efforts by two physicians. The Nexplanon could not be removed. Will refer to orthopedic surgery for removal in OR with fluoroscopy.  Steri-Strip was placed approximating the incision. Hemostasis was noted.  She does not desire alternative birth control. She desires to return to regular menstrual cycle.   Homero Fellers ,MD 12/01/2018,4:29 PM

## 2019-02-05 ENCOUNTER — Telehealth: Payer: Self-pay

## 2019-02-05 ENCOUNTER — Other Ambulatory Visit: Payer: Self-pay | Admitting: Obstetrics and Gynecology

## 2019-02-05 DIAGNOSIS — Z3046 Encounter for surveillance of implantable subdermal contraceptive: Secondary | ICD-10-CM

## 2019-02-05 NOTE — Telephone Encounter (Signed)
Pt aware.

## 2019-02-05 NOTE — Telephone Encounter (Signed)
renewed

## 2019-02-05 NOTE — Telephone Encounter (Signed)
Pt needs a new referral sent in to get her nexplanon removed, she was having issues with her insurance last time, now the nexplanon is bothering her and she has the insurance issue fixed, please renew the referral,

## 2019-03-10 ENCOUNTER — Ambulatory Visit (INDEPENDENT_AMBULATORY_CARE_PROVIDER_SITE_OTHER): Payer: Medicaid Other | Admitting: Orthopaedic Surgery

## 2019-03-10 ENCOUNTER — Ambulatory Visit (INDEPENDENT_AMBULATORY_CARE_PROVIDER_SITE_OTHER): Payer: Medicaid Other

## 2019-03-10 ENCOUNTER — Encounter: Payer: Self-pay | Admitting: Orthopaedic Surgery

## 2019-03-10 ENCOUNTER — Other Ambulatory Visit: Payer: Self-pay

## 2019-03-10 VITALS — Ht 69.0 in | Wt 330.0 lb

## 2019-03-10 DIAGNOSIS — S40852A Superficial foreign body of left upper arm, initial encounter: Secondary | ICD-10-CM

## 2019-03-10 DIAGNOSIS — D1722 Benign lipomatous neoplasm of skin and subcutaneous tissue of left arm: Secondary | ICD-10-CM | POA: Insufficient documentation

## 2019-03-10 NOTE — Progress Notes (Signed)
Office Visit Note   Patient: Heather Irwin           Date of Birth: 11/19/1990           MRN: [id number] Visit Date: 03/10/2019              Requested by: Natale Milch, MD [address] [place],  [place] [address] PCP: Patient, No Pcp Per   Assessment & Plan: Visit Diagnoses:  1. Foreign body of left upper arm     Plan: X-rays were reviewed with the patient today which shows the implanted device in the soft tissues.  Fortunately this has not migrated compared to the previous x-ray.  We discussed removal in the operating room.  Potential risks including infection and scar tissue formation reviewed with the patient.  Patient has elected to proceed with the surgery.  Follow-Up Instructions: Return for 2 week postop visit.   Orders:  Orders Placed This Encounter  Procedures  . XR Humerus Left   No orders of the defined types were placed in this encounter.     Procedures: No procedures performed   Clinical Data: No additional findings.   Subjective: Chief Complaint  Patient presents with  . Arm Pain    Left    Heather Irwin is a 29 year old female comes in for evaluation of implanted Nexplanon in her left arm.  This was placed 2 years ago by her OB/GYN in the office and unfortunately the OB/GYN has been unable to remove this twice under local anesthetic in the office.  She is here for evaluation and removal of the implant.  Denies any numbness and tingling.   Review of Systems  Constitutional: Negative.   HENT: Negative.   Eyes: Negative.   Respiratory: Negative.   Cardiovascular: Negative.   Endocrine: Negative.   Musculoskeletal: Negative.   Neurological: Negative.   Hematological: Negative.   Psychiatric/Behavioral: Negative.   All other systems reviewed and are negative.    Objective: Vital Signs: Ht 5\' 9"  (1.753 m)   Wt (!) 330 lb (149.7 kg)   BMI 48.73 kg/m   Physical Exam Vitals and nursing note reviewed.  Constitutional:    Appearance: She is well-developed.  HENT:     Head: Normocephalic and atraumatic.  Pulmonary:     Effort: Pulmonary effort is normal.  Abdominal:     Palpations: Abdomen is soft.  Musculoskeletal:     Cervical back: Neck supple.  Skin:    General: Skin is warm.     Capillary Refill: Capillary refill takes less than 2 seconds.  Neurological:     Mental Status: She is alert and oriented to person, place, and time.  Psychiatric:        Behavior: Behavior normal.        Thought Content: Thought content normal.        Judgment: Judgment normal.     Ortho Exam  Left upper arm shows a small healed surgical scar.  I am not able to palpate the implanted device.  There is no neurovascular compromise. Specialty Comments:  No specialty comments available.  Imaging: XR Humerus Left  Result Date: 03/10/2019 There is a foreign body in the medial soft tissues of the upper arm.    PMFS History: Patient Active Problem List   Diagnosis Date Noted  . Foreign body of left upper arm 03/10/2019   Past Medical History:  Diagnosis Date  . Morbid obesity (HCC)     History reviewed. No pertinent family history.  History reviewed. No pertinent surgical history. Social History   Occupational History  . Not on file  Tobacco Use  . Smoking status: Never Smoker  . Smokeless tobacco: Never Used  Substance and Sexual Activity  . Alcohol use: Yes  . Drug use: No  . Sexual activity: Yes    Partners: Male    Birth control/protection: Implant

## 2019-04-07 ENCOUNTER — Encounter (HOSPITAL_BASED_OUTPATIENT_CLINIC_OR_DEPARTMENT_OTHER): Payer: Self-pay | Admitting: Orthopaedic Surgery

## 2019-04-07 NOTE — Progress Notes (Signed)
Chart reviewed by Dr. Richardson Landry and due to BMI over 45, patient will need to be done at main. Sherrie at Dr. Roda Shutters office aware.

## 2019-04-11 ENCOUNTER — Other Ambulatory Visit (HOSPITAL_COMMUNITY): Payer: Medicaid Other

## 2019-04-29 ENCOUNTER — Inpatient Hospital Stay: Payer: Medicaid Other | Admitting: Orthopaedic Surgery

## 2019-05-05 ENCOUNTER — Other Ambulatory Visit (HOSPITAL_COMMUNITY): Payer: Medicaid Other

## 2019-05-06 ENCOUNTER — Other Ambulatory Visit: Payer: Self-pay

## 2019-05-06 ENCOUNTER — Other Ambulatory Visit (HOSPITAL_COMMUNITY)
Admission: RE | Admit: 2019-05-06 | Discharge: 2019-05-06 | Disposition: A | Discharge status: Home / Self Care | Payer: Medicaid Other | Source: Ambulatory Visit | Attending: Orthopaedic Surgery | Admitting: Orthopaedic Surgery

## 2019-05-06 ENCOUNTER — Encounter (HOSPITAL_COMMUNITY): Payer: Self-pay | Admitting: Orthopaedic Surgery

## 2019-05-06 DIAGNOSIS — Z20822 Contact with and (suspected) exposure to covid-19: Secondary | ICD-10-CM | POA: Diagnosis not present

## 2019-05-06 DIAGNOSIS — Z01812 Encounter for preprocedural laboratory examination: Secondary | ICD-10-CM | POA: Insufficient documentation

## 2019-05-06 LAB — SARS CORONAVIRUS 2 (TAT 6-24 HRS): SARS Coronavirus 2: NEGATIVE

## 2019-05-06 NOTE — Progress Notes (Signed)
Pt denies SOB, chest pain, and being under the care of a cardiologist and PCP. Pt denies having a stress test, echo and cardiac cath. Pt denies having an EKG and chest x ray. Pt denies recent labs. Pt made aware to stop taking Aspirin (unless otherwise advised by surgeon), vitamins, fish oil and herbal medications. Do not take any NSAIDs ie: Ibuprofen, Advil, Naproxen (Aleve), Motrin, Excedrin Migraine, BC and Goody Powder. Pt reminded to quarantine. Pt verbalized understanding of all pre-op instructions.

## 2019-05-07 MED ORDER — CEFAZOLIN SODIUM 10 G IJ SOLR
3.0000 g | INTRAMUSCULAR | Status: AC
  Administered 2019-05-08: 3 g via INTRAVENOUS
  Filled 2019-05-07: qty 3

## 2019-05-08 ENCOUNTER — Ambulatory Visit (HOSPITAL_COMMUNITY): Payer: Self-pay | Admitting: Anesthesiology

## 2019-05-08 ENCOUNTER — Encounter (HOSPITAL_COMMUNITY): Payer: Self-pay | Admitting: Orthopaedic Surgery

## 2019-05-08 ENCOUNTER — Encounter (HOSPITAL_COMMUNITY)
Admission: RE | Disposition: A | Discharge status: Home / Self Care | Payer: Self-pay | Source: Home / Self Care | Attending: Orthopaedic Surgery

## 2019-05-08 ENCOUNTER — Ambulatory Visit (HOSPITAL_COMMUNITY)
Admission: RE | Admit: 2019-05-08 | Discharge: 2019-05-08 | Disposition: A | Discharge status: Home / Self Care | Payer: Self-pay | Attending: Orthopaedic Surgery | Admitting: Orthopaedic Surgery

## 2019-05-08 ENCOUNTER — Other Ambulatory Visit: Payer: Self-pay

## 2019-05-08 DIAGNOSIS — S40852A Superficial foreign body of left upper arm, initial encounter: Secondary | ICD-10-CM

## 2019-05-08 DIAGNOSIS — G43909 Migraine, unspecified, not intractable, without status migrainosus: Secondary | ICD-10-CM | POA: Insufficient documentation

## 2019-05-08 DIAGNOSIS — Z79899 Other long term (current) drug therapy: Secondary | ICD-10-CM | POA: Insufficient documentation

## 2019-05-08 DIAGNOSIS — K219 Gastro-esophageal reflux disease without esophagitis: Secondary | ICD-10-CM | POA: Insufficient documentation

## 2019-05-08 DIAGNOSIS — Z3046 Encounter for surveillance of implantable subdermal contraceptive: Secondary | ICD-10-CM | POA: Insufficient documentation

## 2019-05-08 DIAGNOSIS — F329 Major depressive disorder, single episode, unspecified: Secondary | ICD-10-CM | POA: Insufficient documentation

## 2019-05-08 DIAGNOSIS — Z6841 Body Mass Index (BMI) 40.0 and over, adult: Secondary | ICD-10-CM | POA: Insufficient documentation

## 2019-05-08 HISTORY — DX: Family history of other specified conditions: Z84.89

## 2019-05-08 HISTORY — DX: Headache, unspecified: R51.9

## 2019-05-08 HISTORY — DX: Retained foreign body fragments, unspecified material: Z18.9

## 2019-05-08 HISTORY — PX: FOREIGN BODY REMOVAL: SHX962

## 2019-05-08 HISTORY — DX: Depression, unspecified: F32.A

## 2019-05-08 HISTORY — DX: Gastro-esophageal reflux disease without esophagitis: K21.9

## 2019-05-08 HISTORY — DX: Acute bronchitis, unspecified: J20.9

## 2019-05-08 LAB — HEMOGLOBIN: Hemoglobin: 13.9 g/dL (ref 12.0–15.0)

## 2019-05-08 LAB — POCT PREGNANCY, URINE: Preg Test, Ur: NEGATIVE

## 2019-05-08 SURGERY — FOREIGN BODY REMOVAL ADULT
Anesthesia: General | Site: Arm Upper | Laterality: Left

## 2019-05-08 MED ORDER — ONDANSETRON HCL 4 MG/2ML IJ SOLN
4.0000 mg | Freq: Four times a day (QID) | INTRAMUSCULAR | Status: AC | PRN
  Administered 2019-05-08: 14:00:00 4 mg via INTRAVENOUS

## 2019-05-08 MED ORDER — BUPIVACAINE HCL (PF) 0.25 % IJ SOLN
INTRAMUSCULAR | Status: AC
  Filled 2019-05-08: qty 30

## 2019-05-08 MED ORDER — CEFAZOLIN SODIUM 1 G IJ SOLR
INTRAMUSCULAR | Status: AC
  Filled 2019-05-08: qty 20

## 2019-05-08 MED ORDER — 0.9 % SODIUM CHLORIDE (POUR BTL) OPTIME
TOPICAL | Status: DC | PRN
  Administered 2019-05-08: 1000 mL

## 2019-05-08 MED ORDER — PROPOFOL 10 MG/ML IV BOLUS
INTRAVENOUS | Status: AC
  Filled 2019-05-08: qty 20

## 2019-05-08 MED ORDER — FENTANYL CITRATE (PF) 250 MCG/5ML IJ SOLN
INTRAMUSCULAR | Status: DC | PRN
  Administered 2019-05-08: 25 ug via INTRAVENOUS
  Administered 2019-05-08 (×2): 100 ug via INTRAVENOUS
  Administered 2019-05-08: 25 ug via INTRAVENOUS

## 2019-05-08 MED ORDER — OXYCODONE HCL 5 MG PO TABS: 5.0000 mg | ORAL_TABLET | Freq: Once | ORAL | Status: DC | PRN

## 2019-05-08 MED ORDER — LACTATED RINGERS IV SOLN: INTRAVENOUS | Status: DC

## 2019-05-08 MED ORDER — ROCURONIUM BROMIDE 10 MG/ML (PF) SYRINGE
PREFILLED_SYRINGE | INTRAVENOUS | Status: AC
  Filled 2019-05-08: qty 10

## 2019-05-08 MED ORDER — BUPIVACAINE HCL (PF) 0.25 % IJ SOLN
INTRAMUSCULAR | Status: DC | PRN
  Administered 2019-05-08: 10 mL

## 2019-05-08 MED ORDER — ONDANSETRON HCL 4 MG/2ML IJ SOLN
INTRAMUSCULAR | Status: AC
  Filled 2019-05-08: qty 2

## 2019-05-08 MED ORDER — CEFAZOLIN SODIUM 1 G IJ SOLR
INTRAMUSCULAR | Status: AC
  Filled 2019-05-08: qty 10

## 2019-05-08 MED ORDER — CHLORHEXIDINE GLUCONATE 4 % EX LIQD: 60.0000 mL | Freq: Once | CUTANEOUS | Status: DC

## 2019-05-08 MED ORDER — FENTANYL CITRATE (PF) 250 MCG/5ML IJ SOLN
INTRAMUSCULAR | Status: AC
  Filled 2019-05-08: qty 5

## 2019-05-08 MED ORDER — DEXAMETHASONE SODIUM PHOSPHATE 10 MG/ML IJ SOLN
INTRAMUSCULAR | Status: AC
  Filled 2019-05-08: qty 1

## 2019-05-08 MED ORDER — HYDROCODONE-ACETAMINOPHEN 5-325 MG PO TABS: 1.0000 | ORAL_TABLET | Freq: Three times a day (TID) | ORAL | 0 refills | Status: DC | PRN

## 2019-05-08 MED ORDER — DEXAMETHASONE SODIUM PHOSPHATE 10 MG/ML IJ SOLN
INTRAMUSCULAR | Status: DC | PRN
  Administered 2019-05-08: 10 mg via INTRAVENOUS

## 2019-05-08 MED ORDER — OXYCODONE HCL 5 MG/5ML PO SOLN: 5.0000 mg | Freq: Once | ORAL | Status: DC | PRN

## 2019-05-08 MED ORDER — LIDOCAINE 2% (20 MG/ML) 5 ML SYRINGE
INTRAMUSCULAR | Status: AC
  Filled 2019-05-08: qty 5

## 2019-05-08 MED ORDER — ONDANSETRON HCL 4 MG PO TABS: 4.0000 mg | ORAL_TABLET | Freq: Three times a day (TID) | ORAL | 0 refills | Status: DC | PRN

## 2019-05-08 MED ORDER — MIDAZOLAM HCL 2 MG/2ML IJ SOLN
INTRAMUSCULAR | Status: AC
  Filled 2019-05-08: qty 2

## 2019-05-08 MED ORDER — SUGAMMADEX SODIUM 200 MG/2ML IV SOLN
INTRAVENOUS | Status: DC | PRN
  Administered 2019-05-08: 200 mg via INTRAVENOUS

## 2019-05-08 MED ORDER — PROPOFOL 10 MG/ML IV BOLUS
INTRAVENOUS | Status: DC | PRN
  Administered 2019-05-08: 150 mg via INTRAVENOUS

## 2019-05-08 MED ORDER — FENTANYL CITRATE (PF) 100 MCG/2ML IJ SOLN: 25.0000 ug | INTRAMUSCULAR | Status: DC | PRN

## 2019-05-08 MED ORDER — ROCURONIUM BROMIDE 10 MG/ML (PF) SYRINGE
PREFILLED_SYRINGE | INTRAVENOUS | Status: DC | PRN
  Administered 2019-05-08: 50 mg via INTRAVENOUS

## 2019-05-08 MED ORDER — MIDAZOLAM HCL 2 MG/2ML IJ SOLN
INTRAMUSCULAR | Status: DC | PRN
  Administered 2019-05-08: 2 mg via INTRAVENOUS

## 2019-05-08 MED ORDER — LIDOCAINE 2% (20 MG/ML) 5 ML SYRINGE
INTRAMUSCULAR | Status: DC | PRN
  Administered 2019-05-08: 50 mg via INTRAVENOUS

## 2019-05-08 MED ORDER — ONDANSETRON HCL 4 MG/2ML IJ SOLN
INTRAMUSCULAR | Status: DC | PRN
  Administered 2019-05-08: 4 mg via INTRAVENOUS

## 2019-05-08 SURGICAL SUPPLY — 50 items
BNDG COHESIVE 4X5 TAN STRL (GAUZE/BANDAGES/DRESSINGS) ×2 IMPLANT
BNDG COHESIVE 6X5 TAN STRL LF (GAUZE/BANDAGES/DRESSINGS) ×4 IMPLANT
BNDG ELASTIC 3X5.8 VLCR STR LF (GAUZE/BANDAGES/DRESSINGS) IMPLANT
BNDG GAUZE ELAST 4 BULKY (GAUZE/BANDAGES/DRESSINGS) ×2 IMPLANT
COVER SURGICAL LIGHT HANDLE (MISCELLANEOUS) ×2 IMPLANT
COVER WAND RF STERILE (DRAPES) ×2 IMPLANT
CUFF TOURN SGL QUICK 24 (TOURNIQUET CUFF)
CUFF TOURN SGL QUICK 34 (TOURNIQUET CUFF)
CUFF TOURN SGL QUICK 42 (TOURNIQUET CUFF) IMPLANT
CUFF TRNQT CYL 24X4X16.5-23 (TOURNIQUET CUFF) IMPLANT
CUFF TRNQT CYL 34X4.125X (TOURNIQUET CUFF) IMPLANT
DRAPE U-SHAPE 47X51 STRL (DRAPES) ×2 IMPLANT
DRSG PAD ABDOMINAL 8X10 ST (GAUZE/BANDAGES/DRESSINGS) ×2 IMPLANT
DRSG TEGADERM 4X4.75 (GAUZE/BANDAGES/DRESSINGS) ×2 IMPLANT
DURAPREP 26ML APPLICATOR (WOUND CARE) ×2 IMPLANT
ELECT REM PT RETURN 9FT ADLT (ELECTROSURGICAL)
ELECTRODE REM PT RTRN 9FT ADLT (ELECTROSURGICAL) IMPLANT
GAUZE SPONGE 4X4 12PLY STRL (GAUZE/BANDAGES/DRESSINGS) ×2 IMPLANT
GAUZE XEROFORM 5X9 LF (GAUZE/BANDAGES/DRESSINGS) ×2 IMPLANT
GLOVE BIOGEL PI IND STRL 7.0 (GLOVE) ×1 IMPLANT
GLOVE BIOGEL PI INDICATOR 7.0 (GLOVE) ×1
GLOVE ECLIPSE 7.0 STRL STRAW (GLOVE) ×2 IMPLANT
GLOVE SKINSENSE NS SZ7.5 (GLOVE) ×2
GLOVE SKINSENSE STRL SZ7.5 (GLOVE) ×2 IMPLANT
GOWN STRL REIN XL XLG (GOWN DISPOSABLE) ×4 IMPLANT
HANDPIECE INTERPULSE COAX TIP (DISPOSABLE)
KIT BASIN OR (CUSTOM PROCEDURE TRAY) ×2 IMPLANT
KIT TURNOVER KIT B (KITS) ×2 IMPLANT
MANIFOLD NEPTUNE II (INSTRUMENTS) ×2 IMPLANT
PACK ORTHO EXTREMITY (CUSTOM PROCEDURE TRAY) ×2 IMPLANT
PAD ARMBOARD 7.5X6 YLW CONV (MISCELLANEOUS) ×4 IMPLANT
PADDING CAST ABS 4INX4YD NS (CAST SUPPLIES) ×1
PADDING CAST ABS COTTON 4X4 ST (CAST SUPPLIES) ×1 IMPLANT
PADDING CAST COTTON 6X4 STRL (CAST SUPPLIES) ×2 IMPLANT
SET HNDPC FAN SPRY TIP SCT (DISPOSABLE) IMPLANT
SPONGE LAP 18X18 RF (DISPOSABLE) ×2 IMPLANT
STOCKINETTE IMPERVIOUS 9X36 MD (GAUZE/BANDAGES/DRESSINGS) ×2 IMPLANT
SUT ETHILON 2 0 FS 18 (SUTURE) ×2 IMPLANT
SUT ETHILON 2 0 PSLX (SUTURE) IMPLANT
SUT MNCRL AB 4-0 PS2 18 (SUTURE) ×2 IMPLANT
SUT VIC AB 2-0 FS1 27 (SUTURE) ×4 IMPLANT
SUT VIC AB 2-0 SH 27 (SUTURE) ×1
SUT VIC AB 2-0 SH 27XBRD (SUTURE) ×1 IMPLANT
SWAB CULTURE ESWAB REG 1ML (MISCELLANEOUS) IMPLANT
TOWEL GREEN STERILE (TOWEL DISPOSABLE) ×2 IMPLANT
TOWEL GREEN STERILE FF (TOWEL DISPOSABLE) ×2 IMPLANT
TUBE CONNECTING 12X1/4 (SUCTIONS) ×2 IMPLANT
UNDERPAD 30X30 (UNDERPADS AND DIAPERS) ×4 IMPLANT
WATER STERILE IRR 1000ML POUR (IV SOLUTION) ×2 IMPLANT
YANKAUER SUCT BULB TIP NO VENT (SUCTIONS) ×2 IMPLANT

## 2019-05-08 NOTE — H&P (Signed)
PREOPERATIVE H&P  Chief Complaint: left humerus foreign body  HPI: Heather Irwin is a 29 y.o. female who presents for surgical treatment of left humerus foreign body.  She denies any changes in medical history.  Past Medical History:  Diagnosis Date  . Bronchitis, acute    as a teen only  . Depression    " no meds"  . Family history of adverse reaction to anesthesia    Mother has HX: of PONV  . GERD (gastroesophageal reflux disease)   . Headache    migraines  . Morbid obesity (HCC)   . Retained foreign body    left humerus   History reviewed. No pertinent surgical history. Social History   Socioeconomic History  . Marital status: Single    Spouse name: Not on file  . Number of children: Not on file  . Years of education: Not on file  . Highest education level: Not on file  Occupational History  . Not on file  Tobacco Use  . Smoking status: Never Smoker  . Smokeless tobacco: Never Used  Substance and Sexual Activity  . Alcohol use: Yes    Comment: occasional  . Drug use: No  . Sexual activity: Yes    Partners: Male    Birth control/protection: Implant  Other Topics Concern  . Not on file  Social History Narrative  . Not on file   Social Determinants of Health   Financial Resource Strain:   . Difficulty of Paying Living Expenses:   Food Insecurity:   . Worried About Programme researcher, broadcasting/film/video in the Last Year:   . Barista in the Last Year:   Transportation Needs:   . Freight forwarder (Medical):   Marland Kitchen Lack of Transportation (Non-Medical):   Physical Activity:   . Days of Exercise per Week:   . Minutes of Exercise per Session:   Stress:   . Feeling of Stress :   Social Connections:   . Frequency of Communication with Friends and Family:   . Frequency of Social Gatherings with Friends and Family:   . Attends Religious Services:   . Active Member of Clubs or Organizations:   . Attends Banker Meetings:   Marland Kitchen Marital Status:     History reviewed. No pertinent family history. Allergies  Allergen Reactions  . Other     Nickel makes the skin itch   Prior to Admission medications   Medication Sig Start Date End Date Taking? Authorizing Provider  aspirin-acetaminophen-caffeine (EXCEDRIN MIGRAINE) [phone] MG tablet Take 2 tablets by mouth every 6 (six) hours as needed for headache.   Yes [provider]     Positive ROS: All other systems have been reviewed and were otherwise negative with the exception of those mentioned in the HPI and as above.  Physical Exam: General: Alert, no acute distress Cardiovascular: No pedal edema Respiratory: No cyanosis, no use of accessory musculature GI: abdomen soft Skin: No lesions in the area of chief complaint Neurologic: Sensation intact distally Psychiatric: Patient is competent for consent with normal mood and affect Lymphatic: no lymphedema  MUSCULOSKELETAL: exam stable  Assessment: left humerus foreign body  Plan: Plan for Procedure(s): LEFT HUMERUS FOREIGN BODY REMOVAL  The risks benefits and alternatives were discussed with the patient including but not limited to the risks of nonoperative treatment, versus surgical intervention including infection, bleeding, nerve injury,  blood clots, cardiopulmonary complications, morbidity, mortality, among others, and they were willing to proceed.  Eduard Roux, MD   05/08/2019 8:29 AM

## 2019-05-08 NOTE — Anesthesia Postprocedure Evaluation (Signed)
Anesthesia Post Note  Patient: Heather Irwin  Procedure(s) Performed: LEFT HUMERUS FOREIGN BODY REMOVAL (Left Arm Upper)     Patient location during evaluation: PACU Anesthesia Type: General Level of consciousness: awake and alert Pain management: pain level controlled Vital Signs Assessment: post-procedure vital signs reviewed and stable Respiratory status: spontaneous breathing, nonlabored ventilation, respiratory function stable and patient connected to nasal cannula oxygen Cardiovascular status: blood pressure returned to baseline and stable Postop Assessment: no apparent nausea or vomiting Anesthetic complications: no    Last Vitals:  Vitals:   05/08/19 1312 05/08/19 1327  BP: 116/76 125/81  Pulse: 77 65  Resp: (!) 21 20  Temp:    SpO2: 98% 100%    Last Pain:  Vitals:   05/08/19 1327  PainSc: Asleep    LLE Motor Response: Purposeful movement (05/08/19 1327) LLE Sensation: Full sensation (05/08/19 1327)          [place] Churchill S

## 2019-05-08 NOTE — Discharge Instructions (Signed)
   Postoperative instructions:  Weightbearing instructions: as tolerated  Keep your dressing and/or splint clean and dry at all times.  You can remove your dressing on post-operative day #3 and change with a dry/sterile dressing or Band-Aids as needed thereafter.    Incision instructions:  Do not soak your incision for 3 weeks after surgery.  If the incision gets wet, pat dry and do not scrub the incision.  Pain control:  You have been given a prescription to be taken as directed for post-operative pain control.  In addition, elevate the operative extremity above the heart at all times to prevent swelling and throbbing pain.  Take over-the-counter Colace, 100mg by mouth twice a day while taking narcotic pain medications to help prevent constipation.  Follow up appointments: 1) 14 days for suture removal and wound check. 2) Dr. Tirza Senteno as scheduled.   -------------------------------------------------------------------------------------------------------------  After Surgery Pain Control:  After your surgery, post-surgical discomfort or pain is likely. This discomfort can last several days to a few weeks. At certain times of the day your discomfort may be more intense.  Did you receive a nerve block?  A nerve block can provide pain relief for one hour to two days after your surgery. As long as the nerve block is working, you will experience little or no sensation in the area the surgeon operated on.  As the nerve block wears off, you will begin to experience pain or discomfort. It is very important that you begin taking your prescribed pain medication before the nerve block fully wears off. Treating your pain at the first sign of the block wearing off will ensure your pain is better controlled and more tolerable when full-sensation returns. Do not wait until the pain is intolerable, as the medicine will be less effective. It is better to treat pain in advance than to try and catch up.  General  Anesthesia:  If you did not receive a nerve block during your surgery, you will need to start taking your pain medication shortly after your surgery and should continue to do so as prescribed by your surgeon.  Pain Medication:  Most commonly we prescribe Vicodin and Percocet for post-operative pain. Both of these medications contain a combination of acetaminophen (Tylenol) and a narcotic to help control pain.   It takes between 30 and 45 minutes before pain medication starts to work. It is important to take your medication before your pain level gets too intense.   Nausea is a common side effect of many pain medications. You will want to eat something before taking your pain medicine to help prevent nausea.   If you are taking a prescription pain medication that contains acetaminophen, we recommend that you do not take additional over the counter acetaminophen (Tylenol).  Other pain relieving options:   Using a cold pack to ice the affected area a few times a day (15 to 20 minutes at a time) can help to relieve pain, reduce swelling and bruising.   Elevation of the affected area can also help to reduce pain and swelling.  

## 2019-05-08 NOTE — Anesthesia Preprocedure Evaluation (Signed)
Anesthesia Evaluation  Patient identified by MRN, date of birth, ID band Patient awake    Reviewed: Allergy & Precautions, H&P , NPO status , Patient's Chart, lab work & pertinent test results  Airway Mallampati: II   Neck ROM: full    Dental   Pulmonary neg pulmonary ROS,    breath sounds clear to auscultation       Cardiovascular negative cardio ROS   Rhythm:regular Rate:Normal     Neuro/Psych  Headaches, PSYCHIATRIC DISORDERS Depression    GI/Hepatic GERD  ,  Endo/Other  Morbid obesity  Renal/GU      Musculoskeletal   Abdominal   Peds  Hematology   Anesthesia Other Findings   Reproductive/Obstetrics                             Anesthesia Physical Anesthesia Plan  ASA: II  Anesthesia Plan: General   Post-op Pain Management:    Induction: Intravenous  PONV Risk Score and Plan: 3 and Ondansetron, Dexamethasone, Midazolam and Treatment may vary due to age or medical condition  Airway Management Planned: Oral ETT  Additional Equipment:   Intra-op Plan:   Post-operative Plan: Extubation in OR  Informed Consent: I have reviewed the patients History and Physical, chart, labs and discussed the procedure including the risks, benefits and alternatives for the proposed anesthesia with the patient or authorized representative who has indicated his/her understanding and acceptance.       Plan Discussed with: CRNA, Anesthesiologist and Surgeon  Anesthesia Plan Comments:         Anesthesia Quick Evaluation

## 2019-05-08 NOTE — Transfer of Care (Signed)
Immediate Anesthesia Transfer of Care Note  Patient: Heather Irwin  Procedure(s) Performed: LEFT HUMERUS FOREIGN BODY REMOVAL (Left Arm Upper)  Patient Location: PACU  Anesthesia Type:General  Level of Consciousness: awake, alert , oriented, patient cooperative and responds to stimulation  Airway & Oxygen Therapy: Patient Spontanous Breathing and Patient connected to nasal cannula oxygen  Post-op Assessment: Report given to RN, Post -op Vital signs reviewed and stable and Patient moving all extremities X 4  Post vital signs: Reviewed and stable  Last Vitals:  Vitals Value Taken Time  BP 116/80 05/08/19 1257  Temp    Pulse 86 05/08/19 1258  Resp 20 05/08/19 1258  SpO2 100 % 05/08/19 1258  Vitals shown include unvalidated device data.  Last Pain:  Vitals:   05/08/19 1026  PainSc: 0-No pain      Patients Stated Pain Goal: 5 (05/08/19 1026)  Complications: No apparent anesthesia complications

## 2019-05-08 NOTE — Op Note (Signed)
   Date of Surgery: 05/08/2019  INDICATIONS: Heather Irwin is a 29 y.o.-year-old female with a retained implantable contraceptive device that was unsuccessfully retrieved and her OB/GYN office under local anesthetic;  The patient did consent to the procedure after discussion of the risks and benefits.  PREOPERATIVE DIAGNOSIS: Retained contraceptive device in left upper arm  POSTOPERATIVE DIAGNOSIS: Same.  PROCEDURE: Removal of retained implantable contraceptive device from left upper arm subcutaneous tissue  SURGEON: N. Glee Arvin, M.D.  ASSIST: Starlyn Skeans [place], [place]; necessary for the timely completion of procedure and due to complexity of procedure.  ANESTHESIA:  general, local  IV FLUIDS AND URINE: See anesthesia.  ESTIMATED BLOOD LOSS: Minimal mL.  IMPLANTS: None  DRAINS: None  COMPLICATIONS: see description of procedure.  DESCRIPTION OF PROCEDURE: The patient was brought to the operating room.  The patient had been signed prior to the procedure and this was documented. The patient had the anesthesia placed by the anesthesiologist.  A time-out was performed to confirm that this was the correct patient, site, side and location. The patient did receive antibiotics prior to the incision and was re-dosed during the procedure as needed at indicated intervals.  The patient had the operative extremity prepped and draped in the standard surgical fashion.    Using fluoroscopic guidance I marked the location of the implant and a 3 cm incision was made directly over this.  Blunt dissection was performed through the subcutaneous fat using tenotomy scissors until we arrived at the implant.  This was removed without difficulty and fluoroscopy was taken afterwards to confirm complete removal.  The wound was then thoroughly irrigated.  Hemostasis was obtained.  Local anesthetic was placed.  Layered closure was performed.  Patient tolerated procedure well had no immediate  complications.  POSTOPERATIVE PLAN: Follow-up in 2 weeks for wound check.  Activity as tolerated to the left upper extremity.  Mayra Reel, MD 2:27 PM

## 2019-05-20 ENCOUNTER — Ambulatory Visit (INDEPENDENT_AMBULATORY_CARE_PROVIDER_SITE_OTHER): Payer: Self-pay | Admitting: Physician Assistant

## 2019-05-20 ENCOUNTER — Encounter: Payer: Self-pay | Admitting: Orthopaedic Surgery

## 2019-05-20 ENCOUNTER — Other Ambulatory Visit: Payer: Self-pay

## 2019-05-20 DIAGNOSIS — S40852A Superficial foreign body of left upper arm, initial encounter: Secondary | ICD-10-CM

## 2019-05-20 NOTE — Progress Notes (Signed)
   Post-Op Visit Note   Patient: Heather Irwin           Date of Birth: 12/12/1990           MRN: [id number] Visit Date: 05/20/2019 PCP: Patient, No Pcp Per   Assessment & Plan:  Chief Complaint:  Chief Complaint  Patient presents with  . Right Upper Arm - Routine Post Op   Visit Diagnoses:  1. Foreign body of left upper arm     Plan: Patient is a pleasant 29 year old female who comes in today 12 days out left humerus foreign body removal 05/08/2019.  She has been doing well.  No fevers or chills.  No pain.  Examination of her left arm reveals a well healing surgical incision without evidence of infection or cellulitis.  Today, new Steri-Strips were applied.  She may resume activity as tolerated.  She will follow-up with [place] in 4 weeks time for final recheck.  Call with concerns or questions in the meantime.  Follow-Up Instructions: Return in about 4 weeks (around 06/17/2019).   Orders:  No orders of the defined types were placed in this encounter.  No orders of the defined types were placed in this encounter.   Imaging: No new imaging  PMFS History: Patient Active Problem List   Diagnosis Date Noted  . Foreign body of left upper arm 03/10/2019   Past Medical History:  Diagnosis Date  . Bronchitis, acute    as a teen only  . Depression    " no meds"  . Family history of adverse reaction to anesthesia    Mother has HX: of PONV  . GERD (gastroesophageal reflux disease)   . Headache    migraines  . Morbid obesity (HCC)   . Retained foreign body    left humerus    No family history on file.  Past Surgical History:  Procedure Laterality Date  . FOREIGN BODY REMOVAL Left 05/08/2019   Procedure: LEFT HUMERUS FOREIGN BODY REMOVAL;  Surgeon: Tarry Kos, MD;  Location: [facility];  Service: Orthopedics;  Laterality: Left;   Social History   Occupational History  . Not on file  Tobacco Use  . Smoking status: Never Smoker  . Smokeless tobacco: Never Used  Substance  and Sexual Activity  . Alcohol use: Yes    Comment: occasional  . Drug use: No  . Sexual activity: Yes    Partners: Male    Birth control/protection: Implant

## 2020-02-09 ENCOUNTER — Other Ambulatory Visit: Payer: Medicaid Other

## 2020-10-21 ENCOUNTER — Ambulatory Visit
Admission: RE | Admit: 2020-10-21 | Discharge: 2020-10-21 | Disposition: A | Discharge status: Home / Self Care | Payer: BC Managed Care – PPO | Source: Ambulatory Visit | Attending: Nurse Practitioner | Admitting: Nurse Practitioner

## 2020-10-21 ENCOUNTER — Other Ambulatory Visit: Payer: Self-pay | Admitting: Nurse Practitioner

## 2020-10-21 ENCOUNTER — Ambulatory Visit
Admission: RE | Admit: 2020-10-21 | Discharge: 2020-10-21 | Disposition: A | Discharge status: Home / Self Care | Payer: BC Managed Care – PPO | Attending: Nurse Practitioner | Admitting: Nurse Practitioner

## 2020-10-21 DIAGNOSIS — M7989 Other specified soft tissue disorders: Secondary | ICD-10-CM | POA: Diagnosis not present

## 2020-10-21 DIAGNOSIS — M79672 Pain in left foot: Secondary | ICD-10-CM

## 2020-11-29 DIAGNOSIS — F32A Depression, unspecified: Secondary | ICD-10-CM | POA: Insufficient documentation

## 2020-11-29 DIAGNOSIS — Z8659 Personal history of other mental and behavioral disorders: Secondary | ICD-10-CM | POA: Insufficient documentation

## 2020-11-30 LAB — COMPREHENSIVE METABOLIC PANEL
Albumin: 4.6 (ref 3.5–5.0)
Calcium: 9.7 (ref 8.7–10.7)
Globulin: 2.6
eGFR: 111

## 2020-11-30 LAB — BASIC METABOLIC PANEL
BUN: 12 (ref 4–21)
CO2: 23 — AB (ref 13–22)
Chloride: 105 (ref 99–108)
Creatinine: 0.7 (ref 0.5–1.1)
Glucose: 85
Potassium: 4.3 mEq/L (ref 3.5–5.1)
Sodium: 142 (ref 137–147)

## 2020-11-30 LAB — LIPID PANEL
Cholesterol: 172 (ref 0–200)
HDL: 46 (ref 35–70)
LDL Cholesterol: 20
LDl/HDL Ratio: 3.7
Triglycerides: 108 (ref 40–160)

## 2020-11-30 LAB — HEPATIC FUNCTION PANEL
ALT: 16 U/L (ref 7–35)
AST: 21 (ref 13–35)
Alkaline Phosphatase: 93 (ref 25–125)
Bilirubin, Total: 0.2

## 2020-11-30 LAB — HEMOGLOBIN A1C: Hemoglobin A1C: 5.2

## 2020-11-30 LAB — CBC AND DIFFERENTIAL
HCT: 41 (ref 36–46)
Hemoglobin: 13.6 (ref 12.0–16.0)
Platelets: 317 10*3/uL (ref 150–400)
WBC: 9.9

## 2020-11-30 LAB — HM HIV SCREENING LAB: HM HIV Screening: NEGATIVE

## 2020-11-30 LAB — VITAMIN B12: Vitamin B-12: 338

## 2020-11-30 LAB — TSH: TSH: 2.15 (ref 0.41–5.90)

## 2020-11-30 LAB — VITAMIN D 25 HYDROXY (VIT D DEFICIENCY, FRACTURES): Vit D, 25-Hydroxy: 27.2

## 2020-11-30 LAB — CBC: RBC: 4.55 (ref 3.87–5.11)

## 2021-02-22 DIAGNOSIS — F909 Attention-deficit hyperactivity disorder, unspecified type: Secondary | ICD-10-CM

## 2021-02-22 HISTORY — DX: Attention-deficit hyperactivity disorder, unspecified type: F90.9

## 2021-02-23 DIAGNOSIS — E559 Vitamin D deficiency, unspecified: Secondary | ICD-10-CM | POA: Insufficient documentation

## 2021-03-02 DIAGNOSIS — F332 Major depressive disorder, recurrent severe without psychotic features: Secondary | ICD-10-CM | POA: Diagnosis not present

## 2021-03-07 DIAGNOSIS — F332 Major depressive disorder, recurrent severe without psychotic features: Secondary | ICD-10-CM | POA: Diagnosis not present

## 2021-03-15 DIAGNOSIS — F332 Major depressive disorder, recurrent severe without psychotic features: Secondary | ICD-10-CM | POA: Diagnosis not present

## 2021-03-15 DIAGNOSIS — M222X9 Patellofemoral disorders, unspecified knee: Secondary | ICD-10-CM | POA: Insufficient documentation

## 2021-03-15 DIAGNOSIS — M222X1 Patellofemoral disorders, right knee: Secondary | ICD-10-CM | POA: Diagnosis not present

## 2021-03-27 DIAGNOSIS — F332 Major depressive disorder, recurrent severe without psychotic features: Secondary | ICD-10-CM | POA: Diagnosis not present

## 2021-04-01 DIAGNOSIS — M25561 Pain in right knee: Secondary | ICD-10-CM | POA: Diagnosis not present

## 2021-04-01 DIAGNOSIS — M25661 Stiffness of right knee, not elsewhere classified: Secondary | ICD-10-CM | POA: Diagnosis not present

## 2021-04-04 DIAGNOSIS — F332 Major depressive disorder, recurrent severe without psychotic features: Secondary | ICD-10-CM | POA: Diagnosis not present

## 2021-04-10 ENCOUNTER — Encounter: Payer: Self-pay | Admitting: Family Medicine

## 2021-04-10 ENCOUNTER — Other Ambulatory Visit: Payer: Self-pay

## 2021-04-10 ENCOUNTER — Ambulatory Visit: Payer: BC Managed Care – PPO | Admitting: Family Medicine

## 2021-04-10 VITALS — BP 128/84 | HR 86 | Ht 69.0 in | Wt 356.0 lb

## 2021-04-10 DIAGNOSIS — F419 Anxiety disorder, unspecified: Secondary | ICD-10-CM | POA: Insufficient documentation

## 2021-04-10 DIAGNOSIS — F32A Depression, unspecified: Secondary | ICD-10-CM

## 2021-04-10 DIAGNOSIS — M222X1 Patellofemoral disorders, right knee: Secondary | ICD-10-CM

## 2021-04-10 DIAGNOSIS — Z7689 Persons encountering health services in other specified circumstances: Secondary | ICD-10-CM | POA: Diagnosis not present

## 2021-04-10 DIAGNOSIS — M222X2 Patellofemoral disorders, left knee: Secondary | ICD-10-CM

## 2021-04-10 MED ORDER — DULOXETINE HCL 30 MG PO CPEP: ORAL_CAPSULE | ORAL | 0 refills | Status: DC

## 2021-04-10 MED ORDER — FLUOXETINE HCL 20 MG PO TABS: 20.0000 mg | ORAL_TABLET | Freq: Every day | ORAL | 1 refills | Status: DC

## 2021-04-10 NOTE — Telephone Encounter (Signed)
Please review. Pt wants to change to different medication. ? ?KP

## 2021-04-10 NOTE — Assessment & Plan Note (Signed)
Patient with bilateral, right greater than left patellofemoral pain syndrome, recently treated with intra-articular cortisone injection to the right knee a few weeks prior by outside group.  She has started physical therapy, attended 1 session.  The nature of this condition reviewed at length in regards to treatments.  Due to comorbid anxiety/depression, plan for duloxetine discussed and she will initiate this at 30 mg x 2-week, titrate to 60 mg until return in 6 weeks. ?

## 2021-04-10 NOTE — Progress Notes (Signed)
?  ? ?  Primary Care / Sports Medicine Office Visit ? ?Patient Information:  ?Patient ID: Heather Irwin, female DOB: 05-30-1990 Age: 31 y.o. MRN: [id number]  ? ?Heather Irwin is a pleasant 31 y.o. female presenting with the following: ? ?Chief Complaint  ?Patient presents with  ? Establish Care  ? Anxiety  ? Depression  ? ADHD  ?  Therapist diagnosed with ADHD, [facility] Elizabeth Poplin  ? ? ?Vitals:  ? 04/10/21 1324  ?BP: 128/84  ?Pulse: 86  ?SpO2: 98%  ? ?Vitals:  ? 04/10/21 1324  ?Weight: (!) 356 lb (161.5 kg)  ?Height: 5\' 9"  (1.753 m)  ? ?Body mass index is 52.57 kg/m?. ? ?No results found.  ? ?Independent interpretation of notes and tests performed by another provider:  ? ?None ? ?Procedures performed:  ? ?None ? ?Pertinent History, Exam, Impression, and Recommendations:  ? ?Anxiety and depression ?Chronic condition, has been seen therapist for treatment of this, did raise concern for possible comorbid ADHD.  No pharmacotherapy today.  We reviewed the nature of this condition, both pharmacologic and nonpharmacologic treatments at length.  Patient did elect to proceed with medication.  Due to comorbid bilateral patellofemoral syndrome, initial plan for duloxetine with titration from 30 to 60 mg in 2 weeks time.  Close follow-up in 6 weeks for annual physical.  Referral to psychiatry was placed for further evaluation management of ADHD. ? ?Chronic condition, symptomatic, Rx management ? ?Patellofemoral stress syndrome ?Patient with bilateral, right greater than left patellofemoral pain syndrome, recently treated with intra-articular cortisone injection to the right knee a few weeks prior by outside group.  She has started physical therapy, attended 1 session.  The nature of this condition reviewed at length in regards to treatments.  Due to comorbid anxiety/depression, plan for duloxetine discussed and she will initiate this at 30 mg x 2-week, titrate to 60 mg until return in 6 weeks.   ? ?Orders & Medications ?Meds ordered this encounter  ?Medications  ? DISCONTD: DULoxetine (CYMBALTA) 30 MG capsule  ?  Sig: Take 1 capsule (30 mg total) by mouth every evening for 14 days, THEN 2 capsules (60 mg total) every evening for 16 days.  ?  Dispense:  46 capsule  ?  Refill:  0  ? FLUoxetine (PROZAC) 20 MG tablet  ?  Sig: Take 1 tablet (20 mg total) by mouth daily.  ?  Dispense:  30 tablet  ?  Refill:  1  ? ?Orders Placed This Encounter  ?Procedures  ? Ambulatory referral to Psychiatry  ? Ambulatory referral to Gynecology  ?  ? ?Return in about 6 weeks (around 05/22/2021) for Annual Physical.  ?  ? ?07/22/2021, MD ? ? Primary Care Sports Medicine ?[facility] ?[facility]  ? ?

## 2021-04-10 NOTE — Assessment & Plan Note (Signed)
Chronic condition, has been seen therapist for treatment of this, did raise concern for possible comorbid ADHD.  No pharmacotherapy today.  We reviewed the nature of this condition, both pharmacologic and nonpharmacologic treatments at length.  Patient did elect to proceed with medication.  Due to comorbid bilateral patellofemoral syndrome, initial plan for duloxetine with titration from 30 to 60 mg in 2 weeks time.  Close follow-up in 6 weeks for annual physical.  Referral to psychiatry was placed for further evaluation management of ADHD. ? ?Chronic condition, symptomatic, Rx management ?

## 2021-04-10 NOTE — Patient Instructions (Signed)
-   Start duloxetine 30 mg nightly, after 2 weeks increase to 60 mg nightly ?- Contact us for any questions regarding this medication ?- Referral coordinator will contact in regards to setting up follow-up with psychiatry and OB/GYN ?- Return for follow-up for annual physical in 6 weeks ?

## 2021-04-11 ENCOUNTER — Other Ambulatory Visit: Payer: Self-pay

## 2021-04-11 DIAGNOSIS — F32A Depression, unspecified: Secondary | ICD-10-CM

## 2021-04-11 DIAGNOSIS — F332 Major depressive disorder, recurrent severe without psychotic features: Secondary | ICD-10-CM | POA: Diagnosis not present

## 2021-04-11 MED ORDER — FLUOXETINE HCL 20 MG PO TABS: 20.0000 mg | ORAL_TABLET | Freq: Every day | ORAL | 1 refills | Status: DC

## 2021-04-18 DIAGNOSIS — F332 Major depressive disorder, recurrent severe without psychotic features: Secondary | ICD-10-CM | POA: Diagnosis not present

## 2021-04-25 DIAGNOSIS — F332 Major depressive disorder, recurrent severe without psychotic features: Secondary | ICD-10-CM | POA: Diagnosis not present

## 2021-05-02 ENCOUNTER — Encounter: Payer: Self-pay | Admitting: Obstetrics

## 2021-05-09 ENCOUNTER — Telehealth (INDEPENDENT_AMBULATORY_CARE_PROVIDER_SITE_OTHER): Payer: BC Managed Care – PPO | Admitting: Family Medicine

## 2021-05-09 ENCOUNTER — Encounter: Payer: Self-pay | Admitting: Family Medicine

## 2021-05-09 DIAGNOSIS — F32A Depression, unspecified: Secondary | ICD-10-CM

## 2021-05-09 DIAGNOSIS — F419 Anxiety disorder, unspecified: Secondary | ICD-10-CM | POA: Diagnosis not present

## 2021-05-09 DIAGNOSIS — M222X2 Patellofemoral disorders, left knee: Secondary | ICD-10-CM

## 2021-05-09 DIAGNOSIS — F332 Major depressive disorder, recurrent severe without psychotic features: Secondary | ICD-10-CM | POA: Diagnosis not present

## 2021-05-09 DIAGNOSIS — M222X1 Patellofemoral disorders, right knee: Secondary | ICD-10-CM

## 2021-05-09 MED ORDER — FLUOXETINE HCL 40 MG PO CAPS: 40.0000 mg | ORAL_CAPSULE | Freq: Every day | ORAL | 1 refills | Status: DC

## 2021-05-09 NOTE — Patient Instructions (Signed)
-   Take new 40 mg Prozac dose daily ?- Maintain follow-up with psychiatry ?- Start exercises from the following website: ?[email or web address] ?-Return for annual physical once scheduled 11/2021 timeframe ?- Contact us for any questions or concerns between now and then ?

## 2021-05-09 NOTE — Progress Notes (Signed)
?  ? ?Primary Care / Sports Medicine Virtual Visit ? ?Patient Information:  ?Patient ID: Heather Irwin, female DOB: 01-Nov-1990 Age: 31 y.o. MRN: [id number]  ? ?Heather Irwin is a pleasant 31 y.o. female presenting with the following: ? ?Chief Complaint  ?Patient presents with  ? Medication Refill  ?  Prozac pt has questions and concerns   ? ? ?Review of Systems: No fevers, chills, night sweats, weight loss, chest pain, or shortness of breath.  ? ?Patient Active Problem List  ? Diagnosis Date Noted  ? Anxiety and depression 04/10/2021  ? Pain in joint of right knee 04/01/2021  ? Stiffness of right knee 04/01/2021  ? Patellofemoral stress syndrome 03/15/2021  ? Foreign body of left upper arm 03/10/2019  ? ?Past Medical History:  ?Diagnosis Date  ? ADHD 02/22/2021  ? Anxiety   ? Bronchitis, acute   ? as a teen only  ? Depression   ? " no meds"  ? Family history of adverse reaction to anesthesia   ? Mother has HX: of PONV  ? GERD (gastroesophageal reflux disease)   ? Headache   ? migraines  ? Morbid obesity ([place])   ? Retained foreign body   ? left humerus  ? ?Outpatient Encounter Medications as of 05/09/2021  ?Medication Sig  ? aspirin-acetaminophen-caffeine (EXCEDRIN MIGRAINE) 250-250-65 MG tablet Take 2 tablets by mouth every 6 (six) hours as needed for headache.  ? FLUoxetine (PROZAC) 40 MG capsule Take 1 capsule (40 mg total) by mouth daily.  ? [DISCONTINUED] ergocalciferol (VITAMIN D2) 1.25 MG (50000 UT) capsule   ? [DISCONTINUED] FLUoxetine (PROZAC) 20 MG tablet Take 1 tablet (20 mg total) by mouth daily.  ? ?No facility-administered encounter medications on file as of 05/09/2021.  ? ?Past Surgical History:  ?Procedure Laterality Date  ? FOREIGN BODY REMOVAL Left 05/08/2019  ? Procedure: LEFT HUMERUS FOREIGN BODY REMOVAL;  Surgeon: Leandrew Koyanagi, MD;  Location: [place];  Service: Orthopedics;  Laterality: Left;  ? ? ?Virtual Visit via MyChart Video:  ? ?I connected with Daiva Huge on 05/09/21 via MyChart  Video and verified that I am speaking with the correct person using appropriate identifiers. ?  ?The limitations, risks, security and privacy concerns of performing an evaluation and management service by MyChart Video, including the higher likelihood of inaccurate diagnoses and treatments, and the availability of in person appointments were reviewed. The possible need of an additional face-to-face encounter for complete and high quality delivery of care was discussed. The patient was also made aware that there may be a patient responsible charge related to this service. The patient expressed understanding and wishes to proceed. ? ?Provider location is in medical facility. ?Patient location is at their home, different from provider location. ?People involved in care of the patient during this telehealth encounter were myself, my nurse/medical assistant, and my front office/scheduling team member. ? ?Objective findings:  ? ?General: Speaking full sentences, no audible heavy breathing. Sounds alert and appropriately interactive. Well-appearing. Face symmetric. Extraocular movements intact. Pupils equal and round. No nasal flaring or accessory muscle use visualized. ? ?Independent interpretation of notes and tests performed by another provider:  ? ?None ? ?Pertinent History, Exam, Impression, and Recommendations:  ? ?Anxiety and depression ?Patient presents for follow-up to depression/anxiety, at last visit on 04/10/2021, after discussion about treatments she opted to proceed with the Prozac 20 mg daily.  Her recent PHQ and GAD scores do show modest improvement, more so in the PHQ.  Subjectively she notes stable symptomatology without significant noticeable change.  Given her reports and scores, I have advised either titration to 40 mg versus transition to alternate regimen.  She has opted to increase to 40 mg of Prozac daily.  She does have an upcoming visit with psychiatry in 1 month's time, that can serve as an  appropriate follow-up to reassess her symptoms and to determine the need for next steps. ? ?Patellofemoral stress syndrome ?Chronic condition for which she received a cortisone injection roughly 2 months prior, noting pain is gradually recurring.  Was unable to continue with physical therapy citing financial barriers.  I advised patient that we can provide home-based exercise materials, website to be included on patient plan.  Additionally she can return to our office if repeat corticosteroid injection(s) are needed. ? ?Orders & Medications ?Meds ordered this encounter  ?Medications  ? FLUoxetine (PROZAC) 40 MG capsule  ?  Sig: Take 1 capsule (40 mg total) by mouth daily.  ?  Dispense:  30 capsule  ?  Refill:  1  ? ?No orders of the defined types were placed in this encounter. ?  ? ?I discussed the above assessment and treatment plan with the patient. The patient was provided an opportunity to ask questions and all were answered. The patient agreed with the plan and demonstrated an understanding of the instructions. ?  ?The patient was advised to call back or seek an in-person evaluation if the symptoms worsen or if the condition fails to improve as anticipated. ?  ?I provided a total time of 30 minutes including both face-to-face and non-face-to-face time on 05/09/2021 inclusive of time utilized for medical chart review, information gathering, care coordination with staff, and documentation completion. ?  ? ?Montel Culver, MD ? ? Primary Care Sports Medicine ?[facility] ?[place]  ? ?

## 2021-05-09 NOTE — Assessment & Plan Note (Signed)
Patient presents for follow-up to depression/anxiety, at last visit on 04/10/2021, after discussion about treatments she opted to proceed with the Prozac 20 mg daily.  Her recent PHQ and GAD scores do show modest improvement, more so in the PHQ.  Subjectively she notes stable symptomatology without significant noticeable change.  Given her reports and scores, I have advised either titration to 40 mg versus transition to alternate regimen.  She has opted to increase to 40 mg of Prozac daily.  She does have an upcoming visit with psychiatry in 1 month's time, that can serve as an appropriate follow-up to reassess her symptoms and to determine the need for next steps. ?

## 2021-05-09 NOTE — Telephone Encounter (Signed)
Please review.  KP

## 2021-05-09 NOTE — Assessment & Plan Note (Signed)
Chronic condition for which she received a cortisone injection roughly 2 months prior, noting pain is gradually recurring.  Was unable to continue with physical therapy citing financial barriers.  I advised patient that we can provide home-based exercise materials, website to be included on patient plan.  Additionally she can return to our office if repeat corticosteroid injection(s) are needed. ?

## 2021-05-15 ENCOUNTER — Ambulatory Visit (INDEPENDENT_AMBULATORY_CARE_PROVIDER_SITE_OTHER): Payer: BC Managed Care – PPO | Admitting: Obstetrics

## 2021-05-15 ENCOUNTER — Other Ambulatory Visit (HOSPITAL_COMMUNITY)
Admission: RE | Admit: 2021-05-15 | Discharge: 2021-05-15 | Disposition: A | Discharge status: Home / Self Care | Payer: BC Managed Care – PPO | Source: Ambulatory Visit | Attending: Obstetrics | Admitting: Obstetrics

## 2021-05-15 VITALS — BP 126/84 | Ht 68.0 in | Wt 358.0 lb

## 2021-05-15 DIAGNOSIS — B372 Candidiasis of skin and nail: Secondary | ICD-10-CM

## 2021-05-15 DIAGNOSIS — Z01419 Encounter for gynecological examination (general) (routine) without abnormal findings: Secondary | ICD-10-CM

## 2021-05-15 DIAGNOSIS — Z124 Encounter for screening for malignant neoplasm of cervix: Secondary | ICD-10-CM | POA: Insufficient documentation

## 2021-05-15 DIAGNOSIS — Z6841 Body Mass Index (BMI) 40.0 and over, adult: Secondary | ICD-10-CM

## 2021-05-15 DIAGNOSIS — N898 Other specified noninflammatory disorders of vagina: Secondary | ICD-10-CM | POA: Diagnosis not present

## 2021-05-15 MED ORDER — FLUCONAZOLE 150 MG PO TABS: 150.0000 mg | ORAL_TABLET | Freq: Once | ORAL | 0 refills | Status: AC

## 2021-05-15 MED ORDER — NYSTATIN-TRIAMCINOLONE 100000-0.1 UNIT/GM-% EX CREA: 1.0000 "application " | TOPICAL_CREAM | Freq: Two times a day (BID) | CUTANEOUS | 1 refills | Status: DC

## 2021-05-15 NOTE — Progress Notes (Signed)
? ? ? ?Gynecology Annual Exam   ?PCP: Jerrol Banana, MD ? ?Chief Complaint:  ?Chief Complaint  ?Patient presents with  ? Annual Exam  ? ? ?History of Present Illness: Patient is a 31 y.o. G1P1001 presents for annual exam. She has not had an annual GYN physical in a number of years. Finds these difficult. She is single (divorced)and has one son who was born vaginally. She works at the [employer]. She has recently established care with a PCP and is expressing a desire to address her overall health. ? ?LMP: Patient's last menstrual period was 05/11/2021. ?Average Interval: regular, 28 days ?Duration of flow:  4-7  days ?Heavy Menses: yes ?Clots: no ?Intermenstrual Bleeding: no ?Postcoital Bleeding: not applicable ?Dysmenorrhea: no ? ?The patient is not currently sexually active. She currently uses abstinence for contraception. She denies dyspareunia.  The patient does not perform self breast exams.  There is notable family history of breast or ovarian cancer in her family. Her maternal aunt had ovarian cancer. ? ?The patient wears seatbelts: yes.   The patient has regular exercise: no.   ? ?The patient denies current symptoms of depression.   ? ?Review of Systems: Review of Systems  ?HENT: Negative.    ?Eyes: Negative.   ?Respiratory: Negative.    ?Cardiovascular: Negative.   ?Gastrointestinal: Negative.   ?Genitourinary: Negative.   ?Musculoskeletal: Negative.   ?Skin:  Positive for itching and rash.  ?     Ongoing struggle with reddish rash along groin lines and around labia.  ?Neurological: Negative.   ?Endo/Heme/Allergies: Negative.   ?Psychiatric/Behavioral:  The patient is nervous/anxious.   ?     Takes Fluoxetine 40 mg daily for anxiety.  ? ?Past Medical History:  ?Patient Active Problem List  ? Diagnosis Date Noted  ? Anxiety and depression 04/10/2021  ?   ?  05/09/2021  ?  2:36 PM 04/10/2021  ?  1:41 PM  ?Depression screen PHQ 2/9  ?Decreased Interest 3 3  ?Down, Depressed, Hopeless 3 3  ?PHQ - 2  Score 6 6  ?Altered sleeping 1 3  ?Tired, decreased energy 1 2  ?Change in appetite 1 2  ?Feeling bad or failure about yourself  0 2  ?Trouble concentrating 1 1  ?Moving slowly or fidgety/restless 1 2  ?Suicidal thoughts 0 0  ?PHQ-9 Score 11 18  ?Difficult doing work/chores  Extremely dIfficult  ? ? ?  05/09/2021  ?  2:37 PM 04/10/2021  ?  1:41 PM  ?GAD 7 : Generalized Anxiety Score  ?Nervous, Anxious, on Edge 2 2  ?Control/stop worrying 2 2  ?Worry too much - different things 2 2  ?Trouble relaxing 2 3  ?Restless 2 3  ?Easily annoyed or irritable 3 3  ?Afraid - awful might happen 0 0  ?Total GAD 7 Score 13 15  ? ? ? ?  ? Pain in joint of right knee 04/01/2021  ? Stiffness of right knee 04/01/2021  ? Patellofemoral stress syndrome 03/15/2021  ? Foreign body of left upper arm 03/10/2019  ? ? ?Past Surgical History:  ?Past Surgical History:  ?Procedure Laterality Date  ? FOREIGN BODY REMOVAL Left 05/08/2019  ? Procedure: LEFT HUMERUS FOREIGN BODY REMOVAL;  Surgeon: Tarry Kos, MD;  Location: [facility];  Service: Orthopedics;  Laterality: Left;  ? ? ?Gynecologic History:  ?Patient's last menstrual period was 05/11/2021. ?Contraception: abstinence ?Last Pap: Results were:  in 2014, no abnormalities  ? ?Obstetric History: G1P1001 ? ?Family History:  ?  Family History  ?Problem Relation Age of Onset  ? Anxiety disorder Mother   ? Depression Mother   ? Bipolar disorder Mother   ? Heart disease Maternal Grandfather   ? ? ?Social History:  ?Social History  ? ?Socioeconomic History  ? Marital status: Single  ?  Spouse name: Not on file  ? Number of children: Not on file  ? Years of education: Not on file  ? Highest education level: Not on file  ?Occupational History  ? Not on file  ?Tobacco Use  ? Smoking status: Never  ? Smokeless tobacco: Never  ?Vaping Use  ? Vaping Use: Never used  ?Substance and Sexual Activity  ? Alcohol use: Yes  ?  Comment: occasional  ? Drug use: No  ? Sexual activity: Not Currently  ?  Partners: Male  ?   Birth control/protection: None  ?Other Topics Concern  ? Not on file  ?Social History Narrative  ? Not on file  ? ?Social Determinants of Health  ? ?Financial Resource Strain: Not on file  ?Food Insecurity: Not on file  ?Transportation Needs: Not on file  ?Physical Activity: Not on file  ?Stress: Not on file  ?Social Connections: Not on file  ?Intimate Partner Violence: Not on file  ? ? ?Allergies:  ?Allergies  ?Allergen Reactions  ? Other   ?  Nickel makes the skin itch  ? ? ?Medications: ?Prior to Admission medications   ?Medication Sig Start Date End Date Taking? Authorizing Provider  ?aspirin-acetaminophen-caffeine (EXCEDRIN MIGRAINE) 250-250-65 MG tablet Take 2 tablets by mouth every 6 (six) hours as needed for headache.   Yes [provider]  ?FLUoxetine (PROZAC) 40 MG capsule Take 1 capsule (40 mg total) by mouth daily. 05/09/21  Yes Jerrol Banana, MD  ?nystatin-triamcinolone [facility]) cream Apply 1 application. topically 2 (two) times daily. 05/15/21  Yes Mirna Mires, CNM  ? ? ?Physical Exam ?Vitals: Blood pressure 126/84, height 5\' 8"  (1.727 m), weight (!) 358 lb (162.4 kg), last menstrual period 05/11/2021. ? ?General: NAD ?HEENT: normocephalic, anicteric ?Thyroid: no enlargement, no palpable nodules ?Pulmonary: No increased work of breathing, CTAB ?Cardiovascular: RRR, distal pulses 2+ ?Breast: Breast symmetrical, no tenderness, no palpable nodules or masses, no skin or nipple retraction present, no nipple discharge.  No axillary or supraclavicular lymphadenopathy. ?Abdomen: NABS, soft, non-tender, non-distended.  Umbilicus without lesions.  No hepatomegaly, splenomegaly or masses palpable. No evidence of hernia  ?Genitourinary: ? External: Normal external female genitalia.  Normal urethral meatus, normal Bartholin's and Skene's glands.   ? Vagina: Normal vaginal mucosa, no evidence of prolapse.   ? Cervix: Grossly normal in appearance, no bleeding ? Uterus: Non-enlarged, mobile,  normal contour.  No CMT ? Adnexa: ovaries non-enlarged, no adnexal masses ? Rectal: deferred ? Lymphatic: no evidence of inguinal lymphadenopathy ?Extremities: no edema, erythema, or tenderness ?Neurologic: Grossly intact ?Psychiatric: mood appropriate, affect full ? ?Female chaperone present for pelvic and breast  portions of the physical exam ? ? ? ?Assessment: 31 y.o. G1P1001 routine annual exam ? ?Plan: ?Problem List Items Addressed This Visit   ?None ?Visit Diagnoses   ? ? Women's annual routine gynecological examination    -  Primary  ? Relevant Orders  ? Cytology - PAP  ? Cervical cancer screening      ? Relevant Orders  ? Cytology - PAP  ? Vaginal discharge      ? Relevant Orders  ? Cervicovaginal ancillary only  ? Yeast dermatitis      ?  Relevant Medications  ? nystatin-triamcinolone (MYCOLOG II) cream  ? Morbid obesity with BMI of 50.0-59.9, adult (HCC)      ? Relevant Orders  ? Amb Referral to Bariatric Surgery  ? ?  ? ? ?2) STI screening  wasoffered and declined ? ?2)  ASCCP guidelines and rational discussed.  Patient opts for every 3 years screening interval ? ?3) Contraception - the patient is currently using  abstinence.  She is happy with her current form of contraception and plans to continue ? ?4) Routine healthcare maintenance including cholesterol, diabetes screening discussed managed by PCP ? ?5) Return in about 1 year (around 05/16/2022) for annual. ?We addressed her desire to imporove her overall health. Her BMI today is 5154 and she is interested in discussing medically assisted weight loss. Is also connected with her PCP who has offered medication. ?Today I have put in a referral for her to the [facility]. ? ?Rx for Mycolog 2 for her yeast dermatitis. She amy also use Monistat. A wet prep indicates yeast vaginitis. Rx for Diflucan. ? ? ?Mirna MiresMargaret M Dlisa Barnwell, CNM  ?05/15/2021 12:01 PM  ? ?[facility] ?05/15/2021, 12:01 PM ? ? ?  ?

## 2021-05-17 ENCOUNTER — Encounter: Payer: Self-pay | Admitting: Obstetrics

## 2021-05-17 LAB — CYTOLOGY - PAP
Adequacy: ABSENT
Diagnosis: NEGATIVE

## 2021-05-17 LAB — CERVICOVAGINAL ANCILLARY ONLY
Bacterial Vaginitis (gardnerella): NEGATIVE
Candida Glabrata: NEGATIVE
Candida Vaginitis: NEGATIVE
Comment: NEGATIVE
Comment: NEGATIVE
Comment: NEGATIVE

## 2021-05-22 ENCOUNTER — Encounter: Payer: BC Managed Care – PPO | Admitting: Family Medicine

## 2021-05-23 DIAGNOSIS — F332 Major depressive disorder, recurrent severe without psychotic features: Secondary | ICD-10-CM | POA: Diagnosis not present

## 2021-06-01 ENCOUNTER — Other Ambulatory Visit: Payer: Self-pay | Admitting: Family Medicine

## 2021-06-01 DIAGNOSIS — F419 Anxiety disorder, unspecified: Secondary | ICD-10-CM

## 2021-06-01 NOTE — Telephone Encounter (Signed)
Requested medication (s) are due for refill today:   No ? ?Requested medication (s) are on the active medication list:   Yes ? ?Future visit scheduled:   No    Seen 3 wks ago ? ? ?Last ordered: 05/09/2021 #30, 1 refill ? ?Returned because pharmacy requesting a 90 day supply and a DX Code  ? ?Requested Prescriptions  ?Pending Prescriptions Disp Refills  ? FLUoxetine (PROZAC) 40 MG capsule [Pharmacy Med Name: FLUOXETINE HCL 40 MG CAPSULE] 90 capsule 1  ?  Sig: Take 1 capsule (40 mg total) by mouth daily.  ?  ? Psychiatry:  Antidepressants - SSRI Passed - 06/01/2021 11:32 AM  ?  ?  Passed - Completed PHQ-2 or PHQ-9 in the last 360 days  ?  ?  Passed - Valid encounter within last 6 months  ?  Recent Outpatient Visits   ? ?      ? 3 weeks ago Anxiety and depression  ? [facility] Jerrol Banana, MD  ? 1 month ago Anxiety and depression  ? [facility] Jerrol Banana, MD  ? ?  ?  ? ? ?  ?  ?  ? ?

## 2021-06-01 NOTE — Telephone Encounter (Signed)
Ok to do 90 days

## 2021-06-02 ENCOUNTER — Telehealth: Payer: Self-pay | Admitting: Psychiatry

## 2021-06-06 ENCOUNTER — Ambulatory Visit: Payer: BC Managed Care – PPO | Admitting: Psychiatry

## 2021-06-06 DIAGNOSIS — F332 Major depressive disorder, recurrent severe without psychotic features: Secondary | ICD-10-CM | POA: Diagnosis not present

## 2021-06-12 ENCOUNTER — Encounter: Payer: Self-pay | Admitting: Obstetrics and Gynecology

## 2021-06-14 DIAGNOSIS — F332 Major depressive disorder, recurrent severe without psychotic features: Secondary | ICD-10-CM | POA: Diagnosis not present

## 2021-06-22 DIAGNOSIS — F332 Major depressive disorder, recurrent severe without psychotic features: Secondary | ICD-10-CM | POA: Diagnosis not present

## 2021-06-27 DIAGNOSIS — F332 Major depressive disorder, recurrent severe without psychotic features: Secondary | ICD-10-CM | POA: Diagnosis not present

## 2021-07-03 ENCOUNTER — Encounter: Payer: Self-pay | Admitting: Psychiatry

## 2021-07-03 ENCOUNTER — Ambulatory Visit (INDEPENDENT_AMBULATORY_CARE_PROVIDER_SITE_OTHER): Payer: BC Managed Care – PPO | Admitting: Psychiatry

## 2021-07-03 VITALS — BP 143/90 | HR 91 | Temp 98.1°F | Wt 359.4 lb

## 2021-07-03 DIAGNOSIS — F330 Major depressive disorder, recurrent, mild: Secondary | ICD-10-CM | POA: Insufficient documentation

## 2021-07-03 DIAGNOSIS — F411 Generalized anxiety disorder: Secondary | ICD-10-CM | POA: Diagnosis not present

## 2021-07-03 DIAGNOSIS — R41840 Attention and concentration deficit: Secondary | ICD-10-CM | POA: Diagnosis not present

## 2021-07-03 NOTE — Patient Instructions (Signed)

## 2021-07-03 NOTE — Progress Notes (Signed)
Psychiatric Initial Adult Assessment   Patient Identification: Heather Irwin MRN:  [id number] Date of Evaluation:  07/03/2021 Referral Source: Joseph Berkshire, MD Chief Complaint:   Chief Complaint  Patient presents with   Establish Care: 31 year old Caucasian female, separated, has a history of depression, anxiety, presented to establish care.   Visit Diagnosis:    ICD-10-CM   1. Mild episode of recurrent major depressive disorder (HCC)  F33.0     2. GAD (generalized anxiety disorder)  F41.1     3. Attention and concentration deficit  R41.840       History of Present Illness:  Heather Irwin is a 31 year old Caucasian female, separated, employed, lives in [place], has a history of depression, anxiety, morbid obesity, patellofemoral stress syndrome, was evaluated in office today, presented to establish care.  Patient reports she has been struggling with worsening depression and anxiety since the death of her mother in 03-12-21.  Her mother who struggled with multiple medical problems as well as history of substance abuse passed away probably due to a cardiac arrest.  Patient reports sadness, anhedonia, lack of motivation, low energy.  Patient also reports anxiety symptoms, feeling nervous, on edge, feeling restless, worrying too much about different things, becoming easily annoyed and irritable, since the past several months.  Patient reports she was able to establish care with a therapist-Ms. Elizabeth Poplin- [facility].  Patient reports she currently follows up with her therapist on a weekly basis.  Patient however reports therapy has been beneficial.  Patient reports she was also started on Prozac dosage increased to 40 mg 6 to 7 weeks ago by her primary care provider.  She reports the Prozac as beneficial.  Denies side effects.  Patient reports she had a difficult childhood, her mother was a single mother and she had 4 other siblings.  Patient reports she grew up poor .     Patient denies any current suicidality, homicidality or perceptual disturbances.  Patient however does report attention and focus deficit, ongoing since the past several years.  She reports she may have had it since the age of 22.  Reports trouble focusing, organizing, staying on task, making careless mistakes and so on.  She is interested in referral for ADHD testing.   Associated Signs/Symptoms: Depression Symptoms:  depressed mood, anhedonia, difficulty concentrating, anxiety, (Hypo) Manic Symptoms:   Denies Anxiety Symptoms:  Excessive Worry, Psychotic Symptoms:   Denies PTSD Symptoms: Negative  Past Psychiatric History: Denies inpatient behavioral health admissions.  Patient was started on Prozac by her primary care provider 2 months ago.  Patient also reports being under the care of a therapist-[facility] -Ms. Lanora Manis Popplins-on a weekly basis since 03-12-2021.  Previous Psychotropic Medications: Yes Prozac.  Substance Abuse History in the last 12 months:  No.  Consequences of Substance Abuse: Negative  Past Medical History:  Past Medical History:  Diagnosis Date   ADHD 02/22/2021   Anxiety    Bronchitis, acute    as a teen only   Depression    " no meds"   Family history of adverse reaction to anesthesia    Mother has HX: of PONV   Family history of ovarian cancer    5/23 cancer genetic testing letter sent   GERD (gastroesophageal reflux disease)    Headache    migraines   Morbid obesity (HCC)    Retained foreign body    left humerus    Past Surgical History:  Procedure Laterality Date   FOREIGN BODY  REMOVAL Left 05/08/2019   Procedure: LEFT HUMERUS FOREIGN BODY REMOVAL;  Surgeon: Tarry Kos, MD;  Location: [facility] OR;  Service: Orthopedics;  Laterality: Left;    Family Psychiatric History: As noted below.  Family History:  Family History  Problem Relation Age of Onset   Anxiety disorder Mother    Depression Mother    Bipolar disorder Mother     Ovarian cancer Maternal Aunt    Heart disease Maternal Grandfather     Social History:   Social History   Socioeconomic History   Marital status: Single    Spouse name: Not on file   Number of children: 1   Years of education: Not on file   Highest education level: High school graduate  Occupational History   Not on file  Tobacco Use   Smoking status: Never   Smokeless tobacco: Never  Vaping Use   Vaping Use: Never used  Substance and Sexual Activity   Alcohol use: Not Currently    Comment: occasional   Drug use: No   Sexual activity: Not Currently    Partners: Male    Birth control/protection: None  Other Topics Concern   Not on file  Social History Narrative   Not on file   Social Determinants of Health   Financial Resource Strain: Not on file  Food Insecurity: Not on file  Transportation Needs: Not on file  Physical Activity: Not on file  Stress: Not on file  Social Connections: Not on file    Additional Social History: Patient reports she had a difficult childhood, she was raised by her mother.  Patient has 4 other siblings.  They grew up poor.  Patient reports her dad was not involved.  Graduated high school.  Patient currently works at [employer].  Patient is married however they are separated.  Patient has a son-Isaac who is 68 years old.  Patient currently lives in [place].  Allergies:   Allergies  Allergen Reactions   Other     Nickel makes the skin itch    Metabolic Disorder Labs: Lab Results  Component Value Date   HGBA1C 5.2 11/30/2020   No results found for: "PROLACTIN" Lab Results  Component Value Date   CHOL 172 11/30/2020   TRIG 108 11/30/2020   HDL 46 11/30/2020   LDLCALC 20 11/30/2020   Lab Results  Component Value Date   TSH 2.15 11/30/2020    Therapeutic Level Labs: No results found for: "LITHIUM" No results found for: "CBMZ" No results found for: "VALPROATE"  Current Medications: Current Outpatient  Medications  Medication Sig Dispense Refill   fluconazole (DIFLUCAN) 150 MG tablet fluconazole 150 mg tablet  TAKE 1 TABLET (150 MG TOTAL) BY MOUTH ONCE FOR 1 DOSE. MAY REPEAT 3 DAYS LATER IF SYMPTOMS PERSIST     FLUoxetine (PROZAC) 40 MG capsule TAKE 1 CAPSULE (40 MG TOTAL) BY MOUTH DAILY. 90 capsule 0   nystatin-triamcinolone (MYCOLOG II) cream Apply 1 application. topically 2 (two) times daily. 30 g 1   No current facility-administered medications for this visit.    Musculoskeletal: Strength & Muscle Tone: within normal limits Gait & Station: normal Patient leans: N/A  Psychiatric Specialty Exam: Review of Systems  Psychiatric/Behavioral:  Positive for decreased concentration and dysphoric mood. The patient is nervous/anxious.   All other systems reviewed and are negative.   Blood pressure (!) 143/90, pulse 91, temperature 98.1 F (36.7 C), temperature source Temporal, weight (!) 359 lb 6.4 oz (163 kg).Body mass index  is 54.65 kg/m.  General Appearance: Casual  Eye Contact:  Good  Speech:  Clear and Coherent  Volume:  Normal  Mood:  Anxious and Depressed  Affect:  Congruent  Thought Process:  Goal Directed and Descriptions of Associations: Intact  Orientation:  Full (Time, Place, and Person)  Thought Content:  Logical  Suicidal Thoughts:  No  Homicidal Thoughts:  No  Memory:  Immediate;   Fair Recent;   Fair Remote;   Fair  Judgement:  Fair  Insight:  Fair  Psychomotor Activity:  Normal  Concentration:  Concentration: Fair and Attention Span: Fair  Recall:  FiservFair  Fund of Knowledge:Fair  Language: Fair  Akathisia:  No  Handed:  Right  AIMS (if indicated):  not done  Assets:  Communication Skills Desire for Improvement Housing Social Support Talents/Skills  ADL's:  Intact  Cognition: WNL  Sleep:  Fair   Screenings: GAD-7    Flowsheet Row Office Visit from 07/03/2021 in [facility] Video [facility]  Total GAD-7 Score 10 13 15       PHQ2-9    Flowsheet Row Office Visit from 07/03/2021 in [facility] Video Visit from [facility]/09[facility]2021 [facility] Office Visit from [facility]/10/2021 [facility]  PHQ-2 Total Score 1 6 6   PHQ-9 Total Score 8 11 18       Flowsheet Row Office Visit from 07/03/2021 in [facility]  C-SSRS RISK CATEGORY No Risk       Assessment and Plan: Heather Irwin is a 31 year old Caucasian female, employed, separated, [place] in [place], has a history of anxiety, depression, attention and focus deficit, presented to establish care.  Patient with good response to the Prozac however continues to struggle with attention and focus deficit, had no formal neuropsychological testing for ADHD evaluation previously.  Agreeable to referral.  Plan as noted below. The patient demonstrates the following risk factors for suicide: Chronic risk factors for suicide include: psychiatric disorder of depression ,anxiety . Acute risk factors for suicide include: loss (financial, interpersonal, professional). Protective factors for this patient include: positive social support, responsibility to others (children, family), coping skills, and life satisfaction. Considering these factors, the overall suicide risk at this point appears to be low. Patient is appropriate for outpatient follow up.  Plan  MDD-improving Continue Prozac 40 mg p.o. daily with breakfast Continue CBT with Ms. Lanora Manislizabeth Popplins - [facility]  GAD-improving Continue Prozac 40 mg p.o. daily with breakfast Continue CBT  Attention and concentration deficit-unstable Will refer for neuropsychological testing to Dr. Bryson DamesSteven Altabet.  I have reviewed notes per Dr. Joseph BerkshireJason Matthews dated 05/09/2021-patient on Prozac for anxiety and depression-40 mg p.o. daily  Reviewed and discussed labs-dated TSH  11/30/2020-2.15-within normal limits.  Follow-up in clinic in 2 months or sooner if needed.  This note was generated in part or whole with voice recognition software. Voice recognition is usually quite accurate but there are transcription errors that can and very often do occur. I apologize for any typographical errors that were not detected and corrected.     Jomarie LongsSaramma Khyson Sebesta, MD 6/13/20235:31 PM

## 2021-07-06 DIAGNOSIS — F332 Major depressive disorder, recurrent severe without psychotic features: Secondary | ICD-10-CM | POA: Diagnosis not present

## 2021-07-18 DIAGNOSIS — F332 Major depressive disorder, recurrent severe without psychotic features: Secondary | ICD-10-CM | POA: Diagnosis not present

## 2021-07-26 DIAGNOSIS — F332 Major depressive disorder, recurrent severe without psychotic features: Secondary | ICD-10-CM | POA: Diagnosis not present

## 2021-08-02 DIAGNOSIS — F332 Major depressive disorder, recurrent severe without psychotic features: Secondary | ICD-10-CM | POA: Diagnosis not present

## 2021-08-08 DIAGNOSIS — F332 Major depressive disorder, recurrent severe without psychotic features: Secondary | ICD-10-CM | POA: Diagnosis not present

## 2021-08-17 DIAGNOSIS — F332 Major depressive disorder, recurrent severe without psychotic features: Secondary | ICD-10-CM | POA: Diagnosis not present

## 2021-08-25 ENCOUNTER — Telehealth: Payer: Self-pay | Admitting: Psychiatry

## 2021-08-25 NOTE — Telephone Encounter (Signed)
Patient left voicemail and later called again to confirm cancellation of appointment on 08/28/2021 stating she has engaged with another provider and is terminating services with this practice.

## 2021-08-28 ENCOUNTER — Telehealth: Payer: BC Managed Care – PPO | Admitting: Psychiatry

## 2021-08-30 DIAGNOSIS — F332 Major depressive disorder, recurrent severe without psychotic features: Secondary | ICD-10-CM | POA: Diagnosis not present

## 2021-09-08 ENCOUNTER — Other Ambulatory Visit: Payer: Self-pay | Admitting: Family Medicine

## 2021-09-08 DIAGNOSIS — F419 Anxiety disorder, unspecified: Secondary | ICD-10-CM

## 2021-09-08 NOTE — Telephone Encounter (Signed)
Requested Prescriptions  Pending Prescriptions Disp Refills  . FLUoxetine (PROZAC) 40 MG capsule [Pharmacy Med Name: FLUOXETINE HCL 40 MG CAPSULE] 30 capsule 2    Sig: TAKE 1 CAPSULE (40 MG TOTAL) BY MOUTH DAILY.     Psychiatry:  Antidepressants - SSRI Passed - 09/08/2021  2:21 AM      Passed - Completed PHQ-2 or PHQ-9 in the last 360 days      Passed - Valid encounter within last 6 months    Recent Outpatient Visits          4 months ago Anxiety and depression   [facility], Ocie Bob, MD   5 months ago Anxiety and depression   [facility] at [facility], Ocie Bob, MD

## 2021-09-11 DIAGNOSIS — F332 Major depressive disorder, recurrent severe without psychotic features: Secondary | ICD-10-CM | POA: Diagnosis not present

## 2021-09-15 NOTE — Telephone Encounter (Signed)
Error

## 2021-09-19 DIAGNOSIS — F332 Major depressive disorder, recurrent severe without psychotic features: Secondary | ICD-10-CM | POA: Diagnosis not present

## 2021-10-02 DIAGNOSIS — F332 Major depressive disorder, recurrent severe without psychotic features: Secondary | ICD-10-CM | POA: Diagnosis not present

## 2021-10-19 DIAGNOSIS — F332 Major depressive disorder, recurrent severe without psychotic features: Secondary | ICD-10-CM | POA: Diagnosis not present

## 2021-10-23 ENCOUNTER — Encounter: Payer: Self-pay | Admitting: Obstetrics

## 2021-10-23 DIAGNOSIS — F332 Major depressive disorder, recurrent severe without psychotic features: Secondary | ICD-10-CM | POA: Diagnosis not present

## 2021-10-31 DIAGNOSIS — F332 Major depressive disorder, recurrent severe without psychotic features: Secondary | ICD-10-CM | POA: Diagnosis not present

## 2021-11-07 ENCOUNTER — Other Ambulatory Visit: Payer: Self-pay | Admitting: Family Medicine

## 2021-11-07 DIAGNOSIS — F32A Depression, unspecified: Secondary | ICD-10-CM

## 2021-11-09 DIAGNOSIS — F332 Major depressive disorder, recurrent severe without psychotic features: Secondary | ICD-10-CM | POA: Diagnosis not present

## 2021-11-13 ENCOUNTER — Encounter: Payer: Self-pay | Admitting: Family Medicine

## 2021-11-14 ENCOUNTER — Other Ambulatory Visit: Payer: Self-pay | Admitting: Family Medicine

## 2021-11-14 DIAGNOSIS — F332 Major depressive disorder, recurrent severe without psychotic features: Secondary | ICD-10-CM | POA: Diagnosis not present

## 2021-11-14 DIAGNOSIS — F419 Anxiety disorder, unspecified: Secondary | ICD-10-CM

## 2021-11-14 MED ORDER — FLUOXETINE HCL 40 MG PO CAPS: 40.0000 mg | ORAL_CAPSULE | Freq: Every day | ORAL | 0 refills | Status: DC

## 2021-11-14 NOTE — Telephone Encounter (Signed)
Please advise 

## 2021-11-21 ENCOUNTER — Other Ambulatory Visit: Payer: Self-pay | Admitting: Family Medicine

## 2021-11-21 DIAGNOSIS — F332 Major depressive disorder, recurrent severe without psychotic features: Secondary | ICD-10-CM | POA: Diagnosis not present

## 2022-05-23 ENCOUNTER — Telehealth: Payer: Self-pay

## 2022-05-23 NOTE — Transitions of Care (Post Inpatient/ED Visit) (Signed)
   05/23/2022  Name: Heather Irwin MRN: [id number] DOB: 1991-01-02  Today's TOC FU Call Status: Today's TOC FU Call Status:: Successful TOC FU Call Competed TOC FU Call Complete Date: 05/23/22  Transition Care Management Follow-up Telephone Call Date of Discharge: 05/22/22 Discharge Facility: Other ([facility]) Name of Other ([facility]) Discharge Facility: [facility] Type of Discharge: Emergency Department Reason for ED Visit: Other: How have you been since you were released from the hospital?: Better Any questions or concerns?: No  Items Reviewed: Did you receive and understand the discharge instructions provided?: Yes Medications obtained,verified, and reconciled?: Yes (Medications Reviewed) Any new allergies since your discharge?: No Dietary orders reviewed?: No Do you have support at home?: Yes People in Home: spouse  Medications Reviewed Today: Medications Reviewed Today     Reviewed by Annabell Sabal, CMA (Certified Medical Assistant) on 05/23/22 at 1205  Med List Status: <None>   Medication Order Taking? Sig Documenting Provider Last Dose Status Informant  fluconazole (DIFLUCAN) 150 MG tablet [id number] Yes fluconazole 150 mg tablet  TAKE 1 TABLET (150 MG TOTAL) BY MOUTH ONCE FOR 1 DOSE. MAY REPEAT 3 DAYS LATER IF SYMPTOMS PERSIST [provider] Taking Active   FLUoxetine (PROZAC) 40 MG capsule [id number] Yes Take 1 capsule (40 mg total) by mouth daily. Jerrol Banana, MD Taking Active   nystatin-triamcinolone [facility] II) cream [id number] Yes Apply 1 application. topically 2 (two) times daily. Mirna Mires, CNM Taking Active             Home Care and Equipment/Supplies: Were Home Health Services Ordered?: NA Any new equipment or medical supplies ordered?: No  Functional Questionnaire: Do you need assistance with bathing/showering or dressing?: No Do you need assistance with meal preparation?: No Do you need assistance with eating?: No Do you  have difficulty maintaining continence: No Do you need assistance with getting out of bed/getting out of a chair/moving?: No Do you have difficulty managing or taking your medications?: No  Follow up appointments reviewed: PCP Follow-up appointment confirmed?: NA (pt does't have any ins. will call back to make an appt.) [facility]ist Hospital Follow-up appointment confirmed?: NA Do you need transportation to your follow-up appointment?: No Do you understand care options if your condition(s) worsen?: Yes-patient verbalized understanding    SIGNATURE Fredirick Maudlin [facility],[place]

## 2022-05-23 NOTE — Transitions of Care (Post Inpatient/ED Visit) (Deleted)
   05/23/2022  Name: Heather Irwin MRN: [id number] DOB: 03/26/1990  {AMBTOCFU:[id number]} 

## 2022-05-23 NOTE — Transitions of Care (Post Inpatient/ED Visit) (Deleted)
   05/23/2022  Name: Heather Irwin MRN: [id number] DOB: 30-Nov-1990  {AMBTOCFU:[id number]}

## 2022-06-01 IMAGING — CR DG FOOT COMPLETE 3+V*L*
1 series · 3 of 3 positions shown · non-contrast
Comparison: None.

CLINICAL DATA: Dropped heavy object on foot several days ago with
persistent pain and swelling, initial encounter

EXAM:
LEFT FOOT - COMPLETE 3+ VIEW

[Series 1: dg foot complete left · 0.14mm/px · 3 of 3 slices shown]
[im 1/3]
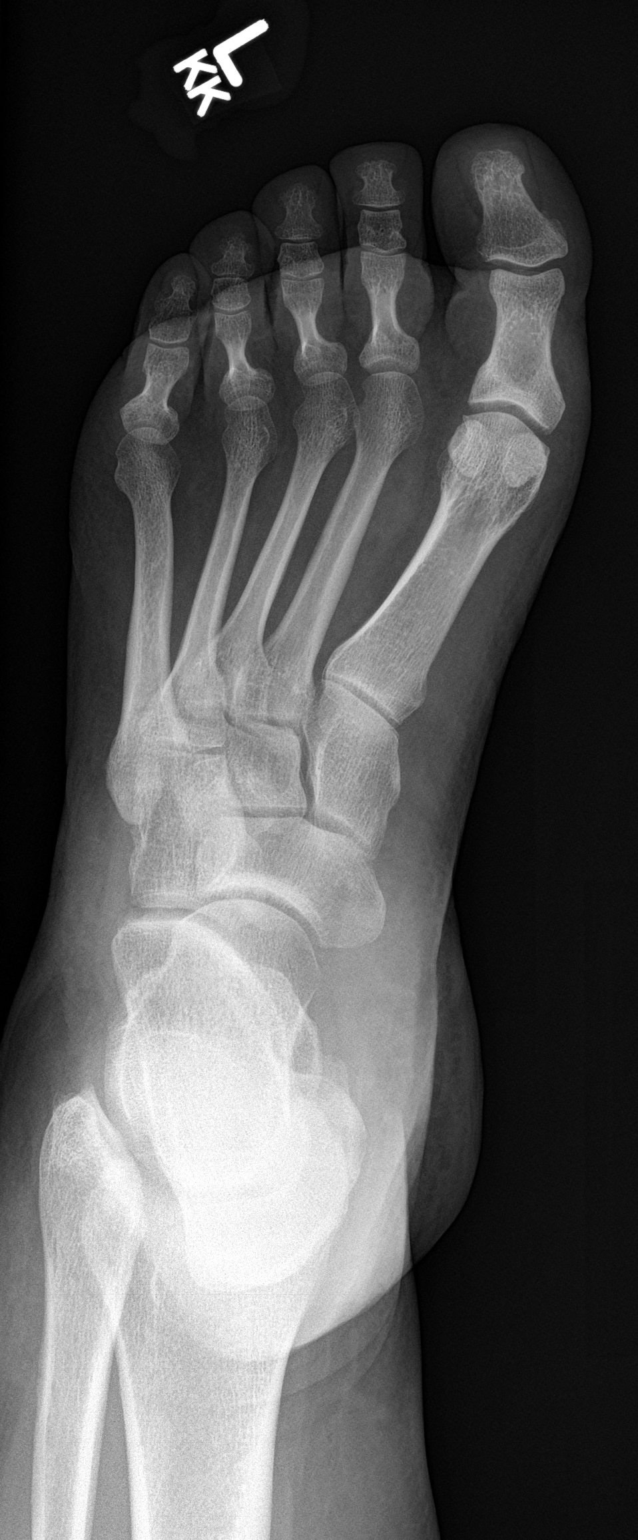
[im 2/3]
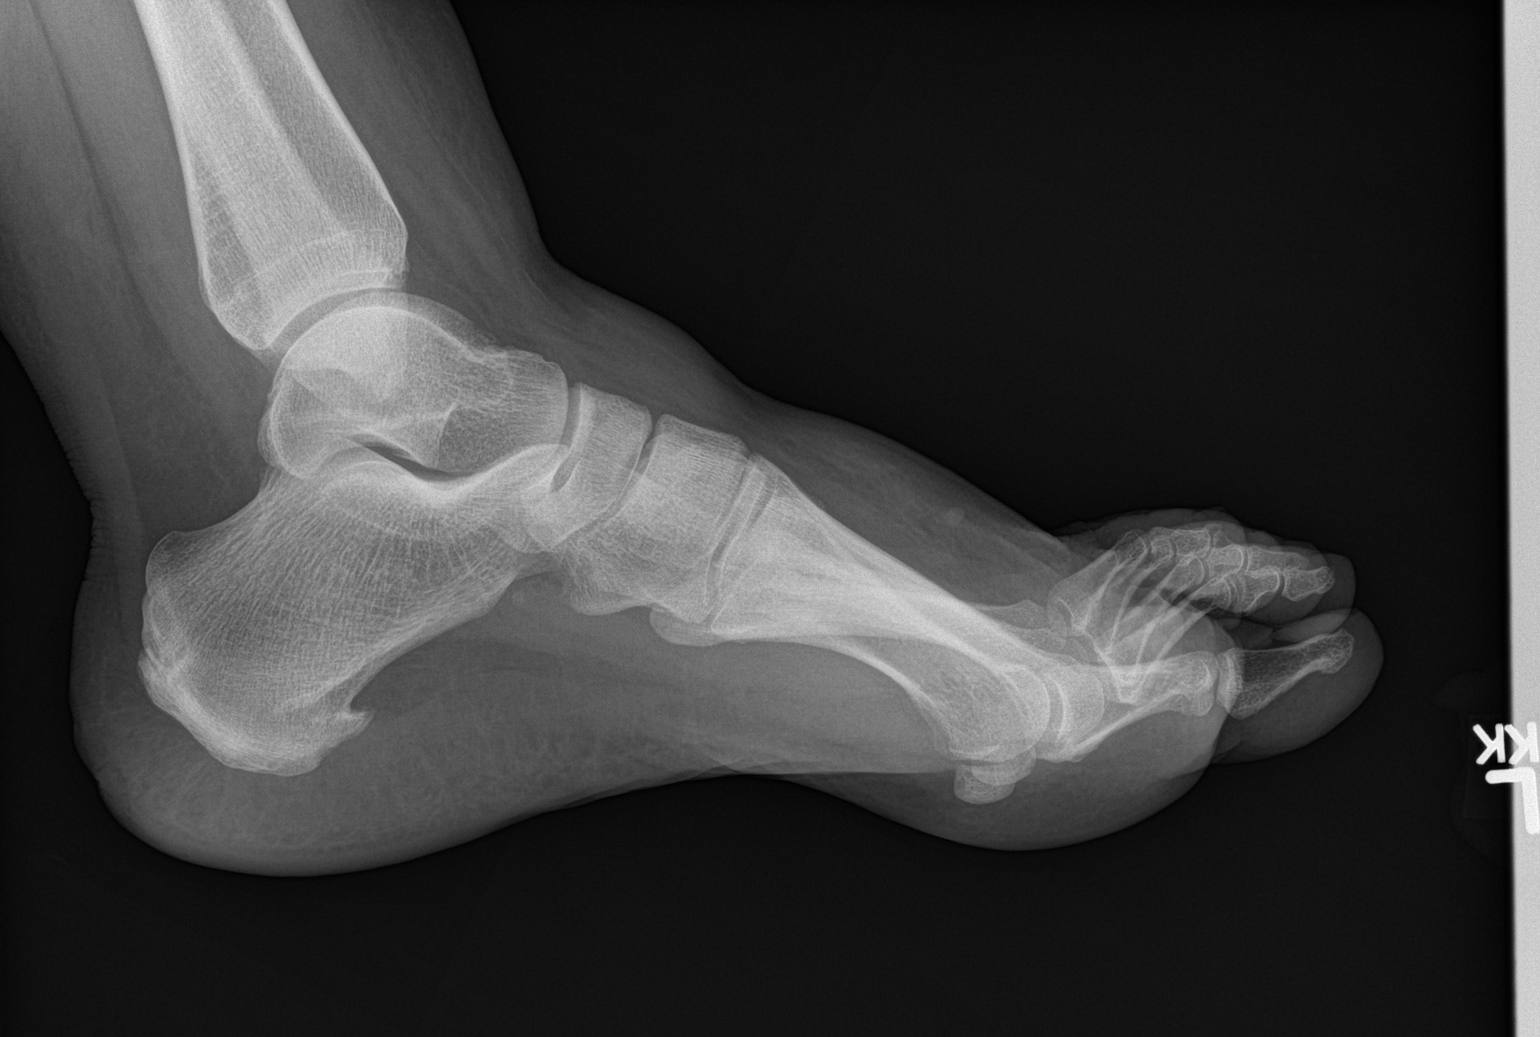
[im 3/3]
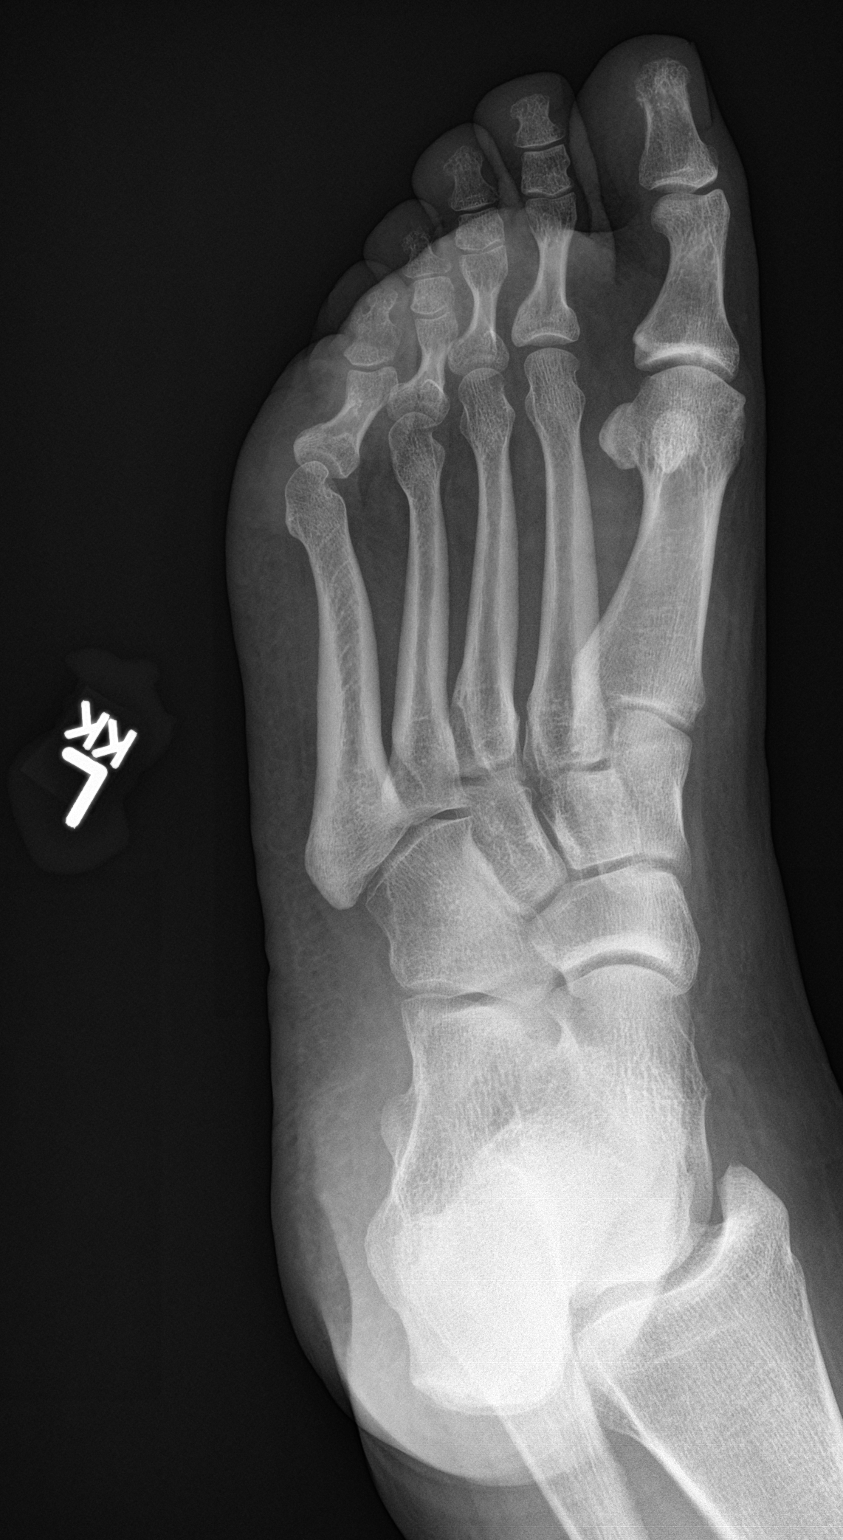

[3 of 3 positions shown; findings below may reference images not displayed]

FINDINGS: Calcaneal spurs are seen. Soft tissue swelling is noted distally in
the area of recent injury. No acute fracture or dislocation is
noted. No other focal abnormality is seen.
IMPRESSION: Soft tissue swelling without acute bony abnormality.

## 2022-06-08 ENCOUNTER — Other Ambulatory Visit: Payer: Self-pay | Admitting: Family Medicine

## 2022-06-08 DIAGNOSIS — F32A Depression, unspecified: Secondary | ICD-10-CM

## 2022-06-08 NOTE — Telephone Encounter (Signed)
Called pt to make follow up appt. Pt currently does not have insurance and needs to know what her financial responsibility would be. Gave pt the phone number for Pt financial. Pt will call them and call back to schedule OV.

## 2022-06-08 NOTE — Telephone Encounter (Signed)
Requested medications are due for refill today.  yes  Requested medications are on the active medications list.  yes  Last refill. 11/14/2021 #180 0 rf  Future visit scheduled.   no  Notes to clinic.  Pt is more than 3 months overdue for OV. Called pt - please see note.    Requested Prescriptions  Pending Prescriptions Disp Refills   FLUoxetine (PROZAC) 40 MG capsule [Pharmacy Med Name: FLUOXETINE HCL 40 MG CAPSULE] 180 capsule 0    Sig: Take 1 capsule (40 mg total) by mouth daily.     Psychiatry:  Antidepressants - SSRI Failed - 06/08/2022  3:23 PM      Failed - Valid encounter within last 6 months    Recent Outpatient Visits           1 year ago Anxiety and depression   [place] [facility] at [facility], Ocie Bob, MD   1 year ago Anxiety and depression   [facility] at [facility], Ocie Bob, MD              Passed - Completed PHQ-2 or PHQ-9 in the last 360 days

## 2022-06-11 ENCOUNTER — Telehealth (INDEPENDENT_AMBULATORY_CARE_PROVIDER_SITE_OTHER): Payer: Medicaid Other | Admitting: Family Medicine

## 2022-06-11 ENCOUNTER — Encounter: Payer: Self-pay | Admitting: Family Medicine

## 2022-06-11 DIAGNOSIS — F32A Depression, unspecified: Secondary | ICD-10-CM | POA: Diagnosis not present

## 2022-06-11 DIAGNOSIS — F419 Anxiety disorder, unspecified: Secondary | ICD-10-CM

## 2022-06-11 DIAGNOSIS — Z6841 Body Mass Index (BMI) 40.0 and over, adult: Secondary | ICD-10-CM

## 2022-06-11 MED ORDER — FLUOXETINE HCL 40 MG PO CAPS: 40.0000 mg | ORAL_CAPSULE | Freq: Every day | ORAL | 0 refills | Status: DC

## 2022-06-11 NOTE — Progress Notes (Signed)
Primary Care / Sports Medicine Virtual Visit  Patient Information:  Patient ID: Heather Irwin, female DOB: 12-25-1990 Age: 32 y.o. MRN: [id number]   Heather Irwin is a pleasant 32 y.o. female presenting with the following:  Chief Complaint  Patient presents with   Anxiety and depression   Obesity    Review of Systems: No fevers, chills, night sweats, weight loss, chest pain, or shortness of breath.   Patient Active Problem List   Diagnosis Date Noted   Mild episode of recurrent major depressive disorder (HCC) 07/03/2021   GAD (generalized anxiety disorder) 07/03/2021   Attention and concentration deficit 07/03/2021   Anxiety and depression 04/10/2021   Pain in joint of right knee 04/01/2021   Stiffness of right knee 04/01/2021   Patellofemoral stress syndrome 03/15/2021   Vitamin D deficiency 02/23/2021   History of depression 11/29/2020   Foreign body of left upper arm 03/10/2019   Past Medical History:  Diagnosis Date   ADHD 02/22/2021   Anxiety    Bronchitis, acute    as a teen only   Depression    " no meds"   Family history of adverse reaction to anesthesia    Mother has HX: of PONV   Family history of ovarian cancer    5/23 cancer genetic testing letter sent   GERD (gastroesophageal reflux disease)    Headache    migraines   Morbid obesity (HCC)    Retained foreign body    left humerus   Outpatient Encounter Medications as of 06/11/2022  Medication Sig   FLUoxetine (PROZAC) 40 MG capsule Take 1 capsule (40 mg total) by mouth daily.   [DISCONTINUED] fluconazole (DIFLUCAN) 150 MG tablet fluconazole 150 mg tablet  TAKE 1 TABLET (150 MG TOTAL) BY MOUTH ONCE FOR 1 DOSE. MAY REPEAT 3 DAYS LATER IF SYMPTOMS PERSIST   [DISCONTINUED] nystatin-triamcinolone (MYCOLOG II) cream Apply 1 application. topically 2 (two) times daily.   No facility-administered encounter medications on file as of 06/11/2022.   Past Surgical History:  Procedure Laterality Date    FOREIGN BODY REMOVAL Left 05/08/2019   Procedure: LEFT HUMERUS FOREIGN BODY REMOVAL;  Surgeon: Tarry Kos, MD;  Location: [facility] OR;  Service: Orthopedics;  Laterality: Left;    Virtual Visit via MyChart Video:   I connected with Eliot Ford on 06/11/22 via MyChart Video and verified that I am speaking with the correct person using appropriate identifiers.   The limitations, risks, security and privacy concerns of performing an evaluation and management service by MyChart Video, including the higher likelihood of inaccurate diagnoses and treatments, and the availability of in person appointments were reviewed. The possible need of an additional face-to-face encounter for complete and high quality delivery of care was discussed. The patient was also made aware that there may be a patient responsible charge related to this service. The patient expressed understanding and wishes to proceed.  Provider location is in medical facility. Patient location is in their car, different from provider location. People involved in care of the patient during this telehealth encounter were myself, my nurse/medical assistant, and my front office/scheduling team member.  Objective findings:   General: Speaking full sentences, no audible heavy breathing. Sounds alert and appropriately interactive. Well-appearing. Face symmetric. Extraocular movements intact. Pupils equal and round. No nasal flaring or accessory muscle use visualized.  Independent interpretation of notes and tests performed by another provider:   None  Pertinent History, Exam, Impression, and Recommendations:  No problem-specific Assessment & Plan notes found for this encounter.   Orders & Medications No orders of the defined types were placed in this encounter.  No orders of the defined types were placed in this encounter.    I discussed the above assessment and treatment plan with the patient. The patient was provided an  opportunity to ask questions and all were answered. The patient agreed with the plan and demonstrated an understanding of the instructions.   The patient was advised to call back or seek an in-person evaluation if the symptoms worsen or if the condition fails to improve as anticipated.   I provided a total time of 30 minutes including both face-to-face and non-face-to-face time on 06/11/2022 inclusive of time utilized for medical chart review, information gathering, care coordination with staff, and documentation completion.    Jerrol Banana, MD, [facility]   Primary Care Sports Medicine Primary Care and Sports Medicine at [facility]

## 2022-06-11 NOTE — Assessment & Plan Note (Signed)
Chronic, progressive, previously had been plugged in with the bariatric surgery but noted insurance related barriers.  Interested in weight management. - Referral for weight management placed today-close follow-up for annual physical with risk stratification labs

## 2022-06-11 NOTE — Assessment & Plan Note (Signed)
Patient has been tolerating fluoxetine well at 40 mg, interim decrease in PHQ and GAD scores related.  Symptoms still not fully controlled and interfering with daily life (interacting with her child).  We discussed treatment options and strategies at length, plan as follows: - Patient will review possible adjunct therapy with buspirone, bupropion, Auvelity - Close follow-up in 1 month for annual physical and risk stratification labs - Patient will research medications and reach out for questions

## 2022-06-11 NOTE — Patient Instructions (Signed)
-   Continue fluoxetine 40 mg daily - Can look into the following add on medications (buspirone, bupropion, Auvelity) - Reach out for any questions - Referral coordinator will contact you to schedule visit with weight management - Return in 1 month for annual physical

## 2022-06-28 NOTE — Telephone Encounter (Signed)
Please advise 

## 2022-07-09 NOTE — Telephone Encounter (Signed)
error 

## 2022-07-24 ENCOUNTER — Ambulatory Visit (INDEPENDENT_AMBULATORY_CARE_PROVIDER_SITE_OTHER): Payer: Medicaid Other | Admitting: Family Medicine

## 2022-07-24 ENCOUNTER — Other Ambulatory Visit: Payer: Self-pay | Admitting: Family Medicine

## 2022-07-24 ENCOUNTER — Encounter: Payer: Self-pay | Admitting: Family Medicine

## 2022-07-24 VITALS — BP 120/76 | HR 80 | Ht 69.0 in | Wt 374.0 lb

## 2022-07-24 DIAGNOSIS — Z1159 Encounter for screening for other viral diseases: Secondary | ICD-10-CM | POA: Diagnosis not present

## 2022-07-24 DIAGNOSIS — Z13220 Encounter for screening for lipoid disorders: Secondary | ICD-10-CM | POA: Diagnosis not present

## 2022-07-24 DIAGNOSIS — Z0000 Encounter for general adult medical examination without abnormal findings: Secondary | ICD-10-CM | POA: Diagnosis not present

## 2022-07-24 DIAGNOSIS — Z131 Encounter for screening for diabetes mellitus: Secondary | ICD-10-CM

## 2022-07-24 DIAGNOSIS — E559 Vitamin D deficiency, unspecified: Secondary | ICD-10-CM | POA: Diagnosis not present

## 2022-07-24 DIAGNOSIS — F419 Anxiety disorder, unspecified: Secondary | ICD-10-CM

## 2022-07-24 MED ORDER — BUPROPION HCL ER (XL) 150 MG PO TB24
150.0000 mg | ORAL_TABLET | Freq: Every day | ORAL | 2 refills | Status: DC
Start: 2022-07-24 — End: 2022-08-22

## 2022-07-24 NOTE — Progress Notes (Signed)
Annual Physical Exam Visit  Patient Information:  Patient ID: Heather Irwin, female DOB: January 19, 1991 Age: 32 y.o. MRN: [id number]   Subjective:   CC: Annual Physical Exam  HPI:  Heather Irwin is here for their annual physical.  I reviewed the past medical history, family history, social history, surgical history, and allergies today and changes were made as necessary.  Please see the problem list section below for additional details.  Past Medical History: Past Medical History:  Diagnosis Date   ADHD 02/22/2021   Anxiety    Bronchitis, acute    as a teen only   Depression    " no meds"   Family history of adverse reaction to anesthesia    Mother has HX: of PONV   Family history of ovarian cancer    5/23 cancer genetic testing letter sent   GERD (gastroesophageal reflux disease)    Headache    migraines   Healthcare maintenance 08/03/2022   Morbid obesity (HCC)    Retained foreign body    left humerus   Past Surgical History: Past Surgical History:  Procedure Laterality Date   FOREIGN BODY REMOVAL Left 05/08/2019   Procedure: LEFT HUMERUS FOREIGN BODY REMOVAL;  Surgeon: Tarry Kos, MD;  Location: [facility] OR;  Service: Orthopedics;  Laterality: Left;   Family History: Family History  Problem Relation Age of Onset   Anxiety disorder Mother    Depression Mother    Bipolar disorder Mother    Arthritis Mother    COPD Mother    Ovarian cancer Maternal Aunt    Heart disease Maternal Grandfather    Allergies: Allergies  Allergen Reactions   Other     Nickel makes the skin itch   Health Maintenance: Health Maintenance  Topic Date Due   DTaP/Tdap/Td (1 - Tdap) Never done   INFLUENZA VACCINE  08/23/2022   PAP SMEAR-Modifier  05/15/2024   Hepatitis C Screening  Completed   HIV Screening  Completed   HPV VACCINES  Aged Out   COVID-19 Vaccine  Discontinued    HM Colonoscopy     This patient has no relevant Health Maintenance data.       Medications: No current outpatient medications on file prior to visit.   No current facility-administered medications on file prior to visit.    Review of Systems: No headache, visual changes, nausea, vomiting, diarrhea, constipation, dizziness, abdominal pain, skin rash, fevers, chills, night sweats, swollen lymph nodes, weight loss, chest pain, body aches, joint swelling, muscle aches, shortness of breath, mood changes, visual or auditory hallucinations reported.  Objective:   Vitals:   07/24/22 1416  BP: 120/76  Pulse: 80  SpO2: 98%   Vitals:   07/24/22 1416  Weight: (!) 374 lb (169.6 kg)  Height: 5\' 9"  (1.753 m)   Body mass index is 55.23 kg/m.  General: Well Developed, well nourished, and in no acute distress.  Neuro: Alert and oriented x3, extra-ocular muscles intact, sensation grossly intact. Cranial nerves II through XII are grossly intact, motor, sensory, and coordinative functions are intact. HEENT: Normocephalic, atraumatic, pupils equal round reactive to light, neck supple, no masses, no lymphadenopathy, thyroid nonpalpable. Oropharynx, nasopharynx, external ear canals are unremarkable. Skin: Warm and dry, no rashes noted.  Cardiac: Regular rate and rhythm, no murmurs rubs or gallops. No peripheral edema. Pulses symmetric. Respiratory: Clear to auscultation bilaterally. Not using accessory muscles, speaking in full sentences.  Abdominal: Soft, nontender, nondistended, positive bowel sounds, no masses, no  organomegaly. Musculoskeletal: Shoulder, elbow, wrist, hip, knee, ankle stable, and with full range of motion.  Female chaperone initials: KG present throughout the physical examination.  Impression and Recommendations:   The patient was counselled, risk factors were discussed, and anticipatory guidance given.  Problem List Items Addressed This Visit       Other   Vitamin D deficiency   Relevant Orders   VITAMIN D 25 Hydroxy (Vit-D Deficiency, Fractures)  (Completed)   Healthcare maintenance - Primary    Annual examination completed, risk stratification labs ordered, anticipatory guidance provided.  We will follow labs once resulted.      Relevant Orders   CBC (Completed)   Comprehensive metabolic panel (Completed)   Hepatitis C antibody (Completed)   Lipid panel (Completed)   TSH (Completed)   VITAMIN D 25 Hydroxy (Vit-D Deficiency, Fractures) (Completed)   Tdap vaccine greater than or equal to 7yo IM   Hemoglobin A1c (Completed)   Other Visit Diagnoses     Annual physical exam       Relevant Orders   CBC (Completed)   Comprehensive metabolic panel (Completed)   Hepatitis C antibody (Completed)   Lipid panel (Completed)   TSH (Completed)   VITAMIN D 25 Hydroxy (Vit-D Deficiency, Fractures) (Completed)   Tdap vaccine greater than or equal to 7yo IM   Hemoglobin A1c (Completed)   Need for hepatitis C screening test       Relevant Orders   Hepatitis C antibody (Completed)   Screening for lipoid disorders       Relevant Orders   Comprehensive metabolic panel (Completed)   Lipid panel (Completed)   Diabetes mellitus screening       Relevant Orders   Comprehensive metabolic panel (Completed)   Hemoglobin A1c (Completed)        Orders & Medications Medications:  Meds ordered this encounter  Medications   buPROPion (WELLBUTRIN XL) 150 MG 24 hr tablet    Sig: Take 1 tablet (150 mg total) by mouth daily.    Dispense:  30 tablet    Refill:  2   Orders Placed This Encounter  Procedures   Tdap vaccine greater than or equal to 7yo IM   CBC   Comprehensive metabolic panel   Hepatitis C antibody   Lipid panel   TSH   VITAMIN D 25 Hydroxy (Vit-D Deficiency, Fractures)   Hemoglobin A1c     No follow-ups on file.    Jerrol Banana, MD, [facility]   Primary Care Sports Medicine Primary Care and Sports Medicine at [facility]

## 2022-07-25 ENCOUNTER — Encounter (INDEPENDENT_AMBULATORY_CARE_PROVIDER_SITE_OTHER): Payer: Self-pay | Admitting: Physician Assistant

## 2022-07-25 ENCOUNTER — Encounter: Payer: Self-pay | Admitting: Family Medicine

## 2022-07-25 ENCOUNTER — Ambulatory Visit (INDEPENDENT_AMBULATORY_CARE_PROVIDER_SITE_OTHER): Payer: Medicaid Other | Admitting: Physician Assistant

## 2022-07-25 VITALS — BP 105/71 | HR 68 | Temp 98.1°F | Ht 68.0 in | Wt 371.0 lb

## 2022-07-25 DIAGNOSIS — E65 Localized adiposity: Secondary | ICD-10-CM

## 2022-07-25 DIAGNOSIS — M222X9 Patellofemoral disorders, unspecified knee: Secondary | ICD-10-CM

## 2022-07-25 DIAGNOSIS — Z6841 Body Mass Index (BMI) 40.0 and over, adult: Secondary | ICD-10-CM | POA: Diagnosis not present

## 2022-07-25 MED ORDER — FLUOXETINE HCL 40 MG PO CAPS
40.0000 mg | ORAL_CAPSULE | Freq: Every day | ORAL | 0 refills | Status: DC
Start: 2022-07-25 — End: 2022-08-22

## 2022-07-25 NOTE — Progress Notes (Signed)
Office: [phone]  /  Fax: [phone]   Initial Visit  Heather Irwin was seen in clinic today to evaluate for obesity. She is interested in losing weight to improve overall health and reduce the risk of weight related complications. She presents today to review program treatment options, initial physical assessment, and evaluation.     She was referred by: PCP  When asked what else they would like to accomplish? She states: Adopt healthier eating patterns, Improve energy levels and physical activity, Improve existing medical conditions, Improve quality of life, Improve appearance, and Improve self-confidence  Weight history: Has had weight concerns for at least the past 5 years. Has done [employer] camp and other things like WW without significant success.   When asked how has your weight affected you? She states: Has affected self-esteem, Contributed to medical problems, Contributed to orthopedic problems or mobility issues, and Has affected mood   Some associated conditions: Other: Patellofemoral syndrome /knee pain, ADHD  Contributing factors: Nutritional, Stress, Reduced physical activity, Eating patterns, and Mental health problems  Weight promoting medications identified: None  Current nutrition plan: None  Current level of physical activity: None and Walking  Current or previous pharmacotherapy: None  Response to medication: Never tried medications   Past medical history includes:   Past Medical History:  Diagnosis Date   ADHD 02/22/2021   Anxiety    Bronchitis, acute    as a teen only   Depression    " no meds"   Family history of adverse reaction to anesthesia    Mother has HX: of PONV   Family history of ovarian cancer    5/23 cancer genetic testing letter sent   GERD (gastroesophageal reflux disease)    Headache    migraines   Morbid obesity (HCC)    Retained foreign body    left humerus     Objective:   BP 105/71   Pulse 68   Temp 98.1 F  (36.7 C)   Ht 5\' 8"  (1.727 m)   Wt (!) 371 lb (168.3 kg)   SpO2 97%   BMI 56.41 kg/m  She was weighed on the bioimpedance scale: Body mass index is 56.41 kg/m.  Peak Weight:374 lbs , Body Fat%:54.2%, Visceral Fat Rating:20, Weight trend over the last 12 months: Increasing  General:  Alert, oriented and cooperative. Patient is in no acute distress.  Respiratory: Normal respiratory effort, no problems with respiration noted   Gait: able to ambulate independently  Mental Status: Normal mood and affect. Normal behavior. Normal judgment and thought content.   DIAGNOSTIC DATA REVIEWED:  BMET    Component Value Date/Time   NA 142 11/30/2020 0000   NA 137 06/16/2012 2000   K 4.3 11/30/2020 0000   K 3.7 06/16/2012 2000   CL 105 11/30/2020 0000   CL 104 06/16/2012 2000   CO2 23 (A) 11/30/2020 0000   CO2 27 06/16/2012 2000   GLUCOSE 89 06/16/2012 2000   BUN 12 11/30/2020 0000   BUN 9 06/16/2012 2000   CREATININE 0.7 11/30/2020 0000   CREATININE 0.78 06/16/2012 2000   CALCIUM 9.7 11/30/2020 0000   CALCIUM 9.1 06/16/2012 2000   GFRNONAA >60 06/16/2012 2000   GFRAA >60 06/16/2012 2000   Lab Results  Component Value Date   HGBA1C 5.2 11/30/2020   No results found for: "INSULIN" CBC    Component Value Date/Time   WBC 9.9 11/30/2020 0000   WBC 22.2 (H) 04/24/2013 0734   RBC 4.55  11/30/2020 0000   HGB 13.6 11/30/2020 0000   HGB 12.8 04/24/2013 0734   HCT 41 11/30/2020 0000   HCT 30.4 (L) 04/25/2013 0419   PLT 317 11/30/2020 0000   PLT 277 04/24/2013 0734   MCV 86 04/24/2013 0734   MCH 28.0 04/24/2013 0734   MCHC 32.7 04/24/2013 0734   RDW 14.0 04/24/2013 0734   Iron/TIBC/Ferritin/ %Sat No results found for: "IRON", "TIBC", "FERRITIN", "IRONPCTSAT" Lipid Panel     Component Value Date/Time   CHOL 172 11/30/2020 0000   TRIG 108 11/30/2020 0000   HDL 46 11/30/2020 0000   LDLCALC 20 11/30/2020 0000   Hepatic Function Panel     Component Value Date/Time   PROT 8.2  06/16/2012 2000   ALBUMIN 4.6 11/30/2020 0000   ALBUMIN 3.9 06/16/2012 2000   AST 21 11/30/2020 0000   AST 22 06/16/2012 2000   ALT 16 11/30/2020 0000   ALT 32 06/16/2012 2000   ALKPHOS 93 11/30/2020 0000   ALKPHOS 92 06/16/2012 2000   BILITOT 0.4 06/16/2012 2000      Component Value Date/Time   TSH 2.15 11/30/2020 0000     Assessment and Plan:   Class 3 severe obesity due to excess calories without serious comorbidity with body mass index (BMI) of 50.0 to 59.9 in adult [facility])  Visceral obesity  Patellofemoral stress syndrome, unspecified laterality        Obesity Treatment / Action Plan:  Patient will work on garnering support from family and friends to begin weight loss journey. Will work on eliminating or reducing the presence of highly palatable, calorie dense foods in the home. Will complete provided nutritional and psychosocial assessment questionnaire before the next appointment. Will be scheduled for indirect calorimetry to determine resting energy expenditure in a fasting state.  This will allow [place] to create a reduced calorie, high-protein meal plan to promote loss of fat mass while preserving muscle mass. Will avoid skipping meals which may result in increased hunger signals and overeating at certain times. Will work on managing stress via relaxation methods as this may result in unhealthy eating patterns. Counseled on the health benefits of losing 5%-15% of total body weight. Was counseled on nutritional approaches to weight loss and benefits of reducing processed foods and consuming plant-based foods and high quality protein as part of nutritional weight management. Was counseled on pharmacotherapy and role as an adjunct in weight management.   Obesity Education Performed Today:  She was weighed on the bioimpedance scale and results were discussed and documented in the synopsis.  We discussed obesity as a disease and the importance of a more detailed evaluation  of all the factors contributing to the disease.  We discussed the importance of long term lifestyle changes which include nutrition, exercise and behavioral modifications as well as the importance of customizing this to her specific health and social needs.  We discussed the benefits of reaching a healthier weight to alleviate the symptoms of existing conditions and reduce the risks of the biomechanical, metabolic and psychological effects of obesity.  BRANDI CLUGSTON appears to be in the action stage of change and states they are ready to start intensive lifestyle modifications and behavioral modifications.  30 minutes was spent today on this visit including the above counseling, pre-visit chart review, and post-visit documentation.  Reviewed by clinician on day of visit: allergies, medications, problem list, medical history, surgical history, family history, social history, and previous encounter notes pertinent to obesity diagnosis.  Devyon Keator,PA-C

## 2022-07-26 LAB — COMPREHENSIVE METABOLIC PANEL
ALT: 13 IU/L (ref 0–32)
AST: 17 IU/L (ref 0–40)
Albumin: 4 g/dL (ref 3.9–4.9)
Alkaline Phosphatase: 78 IU/L (ref 44–121)
BUN/Creatinine Ratio: 13 (ref 9–23)
BUN: 10 mg/dL (ref 6–20)
Bilirubin Total: 0.3 mg/dL (ref 0.0–1.2)
CO2: 23 mmol/L (ref 20–29)
Calcium: 9 mg/dL (ref 8.7–10.2)
Chloride: 104 mmol/L (ref 96–106)
Creatinine, Ser: 0.76 mg/dL (ref 0.57–1.00)
Globulin, Total: 2.8 g/dL (ref 1.5–4.5)
Glucose: 95 mg/dL (ref 70–99)
Potassium: 4.7 mmol/L (ref 3.5–5.2)
Sodium: 139 mmol/L (ref 134–144)
Total Protein: 6.8 g/dL (ref 6.0–8.5)
eGFR: 106 mL/min/{1.73_m2} (ref >59)

## 2022-07-26 LAB — HEMOGLOBIN A1C
Est. average glucose Bld gHb Est-mCnc: 105 mg/dL
Hgb A1c MFr Bld: 5.3 % (ref 4.8–5.6)

## 2022-07-26 LAB — CBC
Hematocrit: 40.5 % (ref 34.0–46.6)
Hemoglobin: 13 g/dL (ref 11.1–15.9)
MCH: 29 pg (ref 26.6–33.0)
MCHC: 32.1 g/dL (ref 31.5–35.7)
MCV: 90 fL (ref 79–97)
Platelets: 294 10*3/uL (ref 150–450)
RBC: 4.49 x10E6/uL (ref 3.77–5.28)
RDW: 13 % (ref 11.7–15.4)
WBC: 8.3 10*3/uL (ref 3.4–10.8)

## 2022-07-26 LAB — HEPATITIS C ANTIBODY: Hep C Virus Ab: NONREACTIVE

## 2022-07-26 LAB — LIPID PANEL
Chol/HDL Ratio: 4.3 ratio (ref 0.0–4.4)
Cholesterol, Total: 187 mg/dL (ref 100–199)
HDL: 43 mg/dL (ref >39)
LDL Chol Calc (NIH): 118 mg/dL — ABNORMAL HIGH (ref 0–99)
Triglycerides: 148 mg/dL (ref 0–149)
VLDL Cholesterol Cal: 26 mg/dL (ref 5–40)

## 2022-07-26 LAB — VITAMIN D 25 HYDROXY (VIT D DEFICIENCY, FRACTURES): Vit D, 25-Hydroxy: 23 ng/mL — ABNORMAL LOW (ref 30.0–100.0)

## 2022-07-26 LAB — TSH: TSH: 3.2 u[IU]/mL (ref 0.450–4.500)

## 2022-08-02 ENCOUNTER — Other Ambulatory Visit: Payer: Self-pay | Admitting: Family Medicine

## 2022-08-02 MED ORDER — VITAMIN D (ERGOCALCIFEROL) 1.25 MG (50000 UNIT) PO CAPS: 50000.0000 [IU] | ORAL_CAPSULE | ORAL | 0 refills | Status: DC

## 2022-08-03 ENCOUNTER — Encounter: Payer: Self-pay | Admitting: Family Medicine

## 2022-08-03 DIAGNOSIS — Z0000 Encounter for general adult medical examination without abnormal findings: Secondary | ICD-10-CM

## 2022-08-03 HISTORY — DX: Encounter for general adult medical examination without abnormal findings: Z00.00

## 2022-08-03 NOTE — Assessment & Plan Note (Signed)
Annual examination completed, risk stratification labs ordered, anticipatory guidance provided.  We will follow labs once resulted. 

## 2022-08-07 DIAGNOSIS — Z0000 Encounter for general adult medical examination without abnormal findings: Secondary | ICD-10-CM | POA: Diagnosis not present

## 2022-08-07 DIAGNOSIS — Z23 Encounter for immunization: Secondary | ICD-10-CM | POA: Diagnosis not present

## 2022-08-15 ENCOUNTER — Other Ambulatory Visit: Payer: Self-pay | Admitting: Family Medicine

## 2022-08-16 NOTE — Telephone Encounter (Signed)
Requested medications are due for refill today.  no  Requested medications are on the active medications list.  yes  Last refill. 07/24/2022 #30 2 rf  Future visit scheduled.   no  Notes to clinic.  Pharmacy is requesting a 90 day rx.    Requested Prescriptions  Pending Prescriptions Disp Refills   buPROPion (WELLBUTRIN XL) 150 MG 24 hr tablet [Pharmacy Med Name: BUPROPION HCL XL 150 MG TABLET] 90 tablet 1    Sig: TAKE 1 TABLET BY MOUTH EVERY DAY     Psychiatry: Antidepressants - bupropion Passed - 08/15/2022  1:32 PM      Passed - Cr in normal range and within 360 days    Creatinine  Date Value Ref Range Status  06/16/2012 0.78 0.60 - 1.30 mg/dL Final   Creatinine, Ser  Date Value Ref Range Status  07/25/2022 0.76 0.57 - 1.00 mg/dL Final         Passed - AST in normal range and within 360 days    AST  Date Value Ref Range Status  07/25/2022 17 0 - 40 IU/L Final   SGOT(AST)  Date Value Ref Range Status  06/16/2012 22 15 - 37 Unit/L Final         Passed - ALT in normal range and within 360 days    ALT  Date Value Ref Range Status  07/25/2022 13 0 - 32 IU/L Final   SGPT (ALT)  Date Value Ref Range Status  06/16/2012 32 12 - 78 U/L Final         Passed - Completed PHQ-2 or PHQ-9 in the last 360 days      Passed - Last BP in normal range    BP Readings from Last 1 Encounters:  07/25/22 105/71         Passed - Valid encounter within last 6 months    Recent Outpatient Visits           3 weeks ago Healthcare maintenance   [facility] at [facility], Ocie Bob, MD   2 months ago Anxiety and depression   [place] [facility] & Sports Medicine at [facility], Ocie Bob, MD   1 year ago Anxiety and depression   [facility] Care & Sports Medicine at [facility], Ocie Bob, MD   1 year ago Anxiety and depression   [facility] Primary Care & Sports Medicine at [facility],  Ocie Bob, MD

## 2022-08-22 ENCOUNTER — Encounter: Payer: Self-pay | Admitting: Family Medicine

## 2022-08-22 ENCOUNTER — Telehealth: Payer: Self-pay | Admitting: Family Medicine

## 2022-08-22 ENCOUNTER — Telehealth: Payer: Medicaid Other | Admitting: Family Medicine

## 2022-08-22 DIAGNOSIS — Z6841 Body Mass Index (BMI) 40.0 and over, adult: Secondary | ICD-10-CM

## 2022-08-22 DIAGNOSIS — F419 Anxiety disorder, unspecified: Secondary | ICD-10-CM | POA: Diagnosis not present

## 2022-08-22 DIAGNOSIS — F32A Depression, unspecified: Secondary | ICD-10-CM

## 2022-08-22 MED ORDER — BUPROPION HCL ER (XL) 150 MG PO TB24: 150.0000 mg | ORAL_TABLET | Freq: Every day | ORAL | 0 refills | Status: DC

## 2022-08-22 MED ORDER — FLUOXETINE HCL 40 MG PO CAPS: 40.0000 mg | ORAL_CAPSULE | Freq: Every day | ORAL | 0 refills | Status: DC

## 2022-08-22 NOTE — Patient Instructions (Signed)
-   Continue fluoxetine (Prozac) 40 mg daily - Continue bupropion (Wellbutrin) 150 mg daily - Coordinator will contact you to schedule behavioral therapy/psychology virtual visits - Reach out to healthy weight and wellness group to coordinate follow-up - Virtual video follow-up visit in 3 months - Contact for any questions/concerns/medication increases between now and then

## 2022-08-22 NOTE — Progress Notes (Signed)
Primary Care / Sports Medicine Virtual Visit  Patient Information:  Patient ID: Heather Irwin, female DOB: 08-03-1990 Age: 32 y.o. MRN: [id number]   Heather Irwin is a pleasant 32 y.o. female presenting with the following:  Chief Complaint  Patient presents with   Medication Refill    Review of Systems: No fevers, chills, night sweats, weight loss, chest pain, or shortness of breath.   Patient Active Problem List   Diagnosis Date Noted   Healthcare maintenance 08/03/2022   BMI 50.0-59.9, adult (HCC) 06/11/2022   Mild episode of recurrent major depressive disorder (HCC) 07/03/2021   Attention and concentration deficit 07/03/2021   Anxiety and depression 04/10/2021   Pain in joint of right knee 04/01/2021   Stiffness of right knee 04/01/2021   Patellofemoral stress syndrome 03/15/2021   Vitamin D deficiency 02/23/2021   History of depression 11/29/2020   Foreign body of left upper arm 03/10/2019   Past Medical History:  Diagnosis Date   ADHD 02/22/2021   Anxiety    Bronchitis, acute    as a teen only   Depression    " no meds"   Family history of adverse reaction to anesthesia    Mother has HX: of PONV   Family history of ovarian cancer    5/23 cancer genetic testing letter sent   GERD (gastroesophageal reflux disease)    Headache    migraines   Healthcare maintenance 08/03/2022   Morbid obesity (HCC)    Retained foreign body    left humerus   Outpatient Encounter Medications as of 08/22/2022  Medication Sig   [DISCONTINUED] buPROPion (WELLBUTRIN XL) 150 MG 24 hr tablet Take 1 tablet (150 mg total) by mouth daily.   [DISCONTINUED] FLUoxetine (PROZAC) 40 MG capsule Take 1 capsule (40 mg total) by mouth daily.   buPROPion (WELLBUTRIN XL) 150 MG 24 hr tablet Take 1 tablet (150 mg total) by mouth daily.   FLUoxetine (PROZAC) 40 MG capsule Take 1 capsule (40 mg total) by mouth daily.   [DISCONTINUED] Vitamin D, Ergocalciferol, (DRISDOL) 1.25 MG (50000 UNIT)  CAPS capsule Take 1 capsule (50,000 Units total) by mouth every 7 (seven) days. Take for 8 total doses(weeks)   No facility-administered encounter medications on file as of 08/22/2022.   Past Surgical History:  Procedure Laterality Date   FOREIGN BODY REMOVAL Left 05/08/2019   Procedure: LEFT HUMERUS FOREIGN BODY REMOVAL;  Surgeon: Tarry Kos, MD;  Location: [facility] OR;  Service: Orthopedics;  Laterality: Left;    Virtual Visit via MyChart Video:   I connected with Heather Irwin on 08/22/22 via MyChart Video and verified that I am speaking with the correct person using appropriate identifiers.   The limitations, risks, security and privacy concerns of performing an evaluation and management service by MyChart Video, including the higher likelihood of inaccurate diagnoses and treatments, and the availability of in person appointments were reviewed. The possible need of an additional face-to-face encounter for complete and high quality delivery of care was discussed. The patient was also made aware that there may be a patient responsible charge related to this service. The patient expressed understanding and wishes to proceed.  Provider location is in medical facility. Patient location is outside, a different from provider location. People involved in care of the patient during this telehealth encounter were myself, my nurse/medical assistant, and my front office/scheduling team member.  Objective findings:   General: Speaking full sentences, no audible heavy breathing. Sounds alert and  appropriately interactive. Well-appearing. Face symmetric. Extraocular movements intact. Pupils equal and round. No nasal flaring or accessory muscle use visualized.  Independent interpretation of notes and tests performed by another provider:   None  Pertinent History, Exam, Impression, and Recommendations:   Anxiety and depression Since the incorporation of Wellbutrin 150 mg in addition to her prior  fluoxetine 40 mg, she reports improvements in symptoms and symptom severity.  Does cite past 1-2 weeks of increased life stressors when reviewing her interim PHQ and GAD scores.  We did discuss possible next steps such as titration of either Wellbutrin and/or fluoxetine among other options.  Plan as follows: - Continue fluoxetine (Prozac) 40 mg daily - Continue bupropion (Wellbutrin) 150 mg daily - Coordinator will contact you to schedule behavioral therapy/psychology virtual visits - Reach out to healthy weight and wellness group to coordinate follow-up - Virtual video follow-up visit in 3 months - Contact for any questions/concerns/medication increases between now and then  BMI 50.0-59.9, adult [facility]) Did establish with healthy weight and wellness over the interim, patient is considering options reviewed during that visit.  Encouraged patient to coordinate appropriate follow-up.  Orders & Medications Meds ordered this encounter  Medications   buPROPion (WELLBUTRIN XL) 150 MG 24 hr tablet    Sig: Take 1 tablet (150 mg total) by mouth daily.    Dispense:  90 tablet    Refill:  0   FLUoxetine (PROZAC) 40 MG capsule    Sig: Take 1 capsule (40 mg total) by mouth daily.    Dispense:  90 capsule    Refill:  0   Orders Placed This Encounter  Procedures   Ambulatory referral to Psychology     I discussed the above assessment and treatment plan with the patient. The patient was provided an opportunity to ask questions and all were answered. The patient agreed with the plan and demonstrated an understanding of the instructions.   The patient was advised to call back or seek an in-person evaluation if the symptoms worsen or if the condition fails to improve as anticipated.   I provided a total time of 30 minutes including both face-to-face and non-face-to-face time on 08/22/2022 inclusive of time utilized for medical chart review, information gathering, care coordination with staff, and  documentation completion.    Heather Banana, MD, [facility]   Primary Care Sports Medicine Primary Care and Sports Medicine at [facility]

## 2022-08-22 NOTE — Assessment & Plan Note (Signed)
Since the incorporation of Wellbutrin 150 mg in addition to her prior fluoxetine 40 mg, she reports improvements in symptoms and symptom severity.  Does cite past 1-2 weeks of increased life stressors when reviewing her interim PHQ and GAD scores.  We did discuss possible next steps such as titration of either Wellbutrin and/or fluoxetine among other options.  Plan as follows: - Continue fluoxetine (Prozac) 40 mg daily - Continue bupropion (Wellbutrin) 150 mg daily - Coordinator will contact you to schedule behavioral therapy/psychology virtual visits - Reach out to healthy weight and wellness group to coordinate follow-up - Virtual video follow-up visit in 3 months - Contact for any questions/concerns/medication increases between now and then

## 2022-08-22 NOTE — Assessment & Plan Note (Signed)
Did establish with healthy weight and wellness over the interim, patient is considering options reviewed during that visit.  Encouraged patient to coordinate appropriate follow-up.

## 2022-08-22 NOTE — Telephone Encounter (Signed)
-----   Message from Jerrol Banana sent at 08/22/2022  1:43 PM EDT ----- Regarding: Needs 24-month virtual visit scheduled Needs 54-month virtual visit scheduled

## 2022-08-22 NOTE — Telephone Encounter (Signed)
Left voice mail to set up follow up

## 2022-09-07 ENCOUNTER — Encounter (INDEPENDENT_AMBULATORY_CARE_PROVIDER_SITE_OTHER): Payer: Self-pay | Admitting: Physician Assistant

## 2022-12-03 ENCOUNTER — Other Ambulatory Visit: Payer: Self-pay

## 2022-12-03 ENCOUNTER — Other Ambulatory Visit: Payer: Self-pay | Admitting: Family Medicine

## 2022-12-03 DIAGNOSIS — F419 Anxiety disorder, unspecified: Secondary | ICD-10-CM

## 2022-12-03 MED ORDER — FLUOXETINE HCL 40 MG PO CAPS
40.0000 mg | ORAL_CAPSULE | Freq: Every day | ORAL | 0 refills | Status: DC
Start: 2022-12-03 — End: 2023-03-04

## 2022-12-03 MED ORDER — BUPROPION HCL ER (XL) 150 MG PO TB24
150.0000 mg | ORAL_TABLET | Freq: Every day | ORAL | 0 refills | Status: DC
Start: 2022-12-03 — End: 2023-03-04

## 2023-03-03 ENCOUNTER — Other Ambulatory Visit: Payer: Self-pay | Admitting: Family Medicine

## 2023-03-03 DIAGNOSIS — F32A Depression, unspecified: Secondary | ICD-10-CM

## 2023-03-04 NOTE — Telephone Encounter (Signed)
 Needs appointment

## 2023-03-04 NOTE — Telephone Encounter (Signed)
 Courtesy refills.  Requested Prescriptions  Pending Prescriptions Disp Refills   buPROPion  (WELLBUTRIN  XL) 150 MG 24 hr tablet [Pharmacy Med Name: BUPROPION  HCL XL 150 MG TABLET] 30 tablet 0    Sig: TAKE 1 TABLET BY MOUTH EVERY DAY     Psychiatry: Antidepressants - bupropion  Failed - 03/04/2023  3:27 PM      Failed - Valid encounter within last 6 months    Recent Outpatient Visits           6 months ago BMI 50.0-59.9, adult (HCC)   [place] [facility], Dessie Flow, MD   7 months ago Healthcare maintenance   [place] [facility] at [facility], Dessie Flow, MD   8 months ago Anxiety and depression   [place] [facility] at [facility], Dessie Flow, MD   1 year ago Anxiety and depression   [place] [facility], Dessie Flow, MD   1 year ago Anxiety and depression   [place] [facility] at [facility], Dessie Flow, MD              Passed - Cr in normal range and within 360 days    Creatinine  Date Value Ref Range Status  06/16/2012 0.78 0.60 - 1.30 mg/dL Final   Creatinine, Ser  Date Value Ref Range Status  07/25/2022 0.76 0.57 - 1.00 mg/dL Final         Passed - AST in normal range and within 360 days    AST  Date Value Ref Range Status  07/25/2022 17 0 - 40 IU/L Final   SGOT(AST)  Date Value Ref Range Status  06/16/2012 22 15 - 37 Unit/L Final         Passed - ALT in normal range and within 360 days    ALT  Date Value Ref Range Status  07/25/2022 13 0 - 32 IU/L Final   SGPT (ALT)  Date Value Ref Range Status  06/16/2012 32 12 - 78 U/L Final         Passed - Completed PHQ-2 or PHQ-9 in the last 360 days      Passed - Last BP in normal range    BP Readings from Last 1 Encounters:  07/25/22 105/71          FLUoxetine  (PROZAC ) 40 MG capsule [Pharmacy Med Name: FLUOXETINE   HCL 40 MG CAPSULE] 30 capsule 0    Sig: TAKE 1 CAPSULE (40 MG TOTAL) BY MOUTH DAILY.     Psychiatry:  Antidepressants - SSRI Failed - 03/04/2023  3:27 PM      Failed - Valid encounter within last 6 months    Recent Outpatient Visits           6 months ago BMI 50.0-59.9, adult [facility])   [place] [facility], Dessie Flow, MD   7 months ago Healthcare maintenance   [facility] at [facility], Dessie Flow, MD   8 months ago Anxiety and depression   [facility] at [facility], Dessie Flow, MD   1 year ago Anxiety and depression   [facility] at [facility], Dessie Flow, MD   1 year ago Anxiety and depression  [facility] at [facility], Dessie Flow, MD              Passed - Completed PHQ-2 or PHQ-9 in the last 360 days

## 2023-03-18 ENCOUNTER — Other Ambulatory Visit: Payer: Self-pay | Admitting: Family Medicine

## 2023-03-18 DIAGNOSIS — F32A Depression, unspecified: Secondary | ICD-10-CM

## 2023-03-19 NOTE — Telephone Encounter (Signed)
 Requested medications are due for refill today.  no  Requested medications are on the active medications list.  yes  Last refill. 03/04/2023 #30 0 rf for both  Future visit scheduled.   no  Notes to clinic.  Pt was given a 30 day courtesy refill. Pharmacy is requesting a 90 day supply. Please advise.    Requested Prescriptions  Pending Prescriptions Disp Refills   buPROPion (WELLBUTRIN XL) 150 MG 24 hr tablet [Pharmacy Med Name: BUPROPION HCL XL 150 MG TABLET] 90 tablet 1    Sig: TAKE 1 TABLET BY MOUTH EVERY DAY     Psychiatry: Antidepressants - bupropion Failed - 03/19/2023  3:34 PM      Failed - Valid encounter within last 6 months    Recent Outpatient Visits           6 months ago BMI 50.0-59.9, adult (HCC)   [place] [facility], Ocie Bob, MD   7 months ago Healthcare maintenance   [place] [facility] at [facility], Ocie Bob, MD   9 months ago Anxiety and depression   [place] [facility] at [facility], Ocie Bob, MD   1 year ago Anxiety and depression   [place] [facility] at [facility], Ocie Bob, MD   1 year ago Anxiety and depression   [place] [facility] at [facility], Ocie Bob, MD              Passed - Cr in normal range and within 360 days    Creatinine  Date Value Ref Range Status  06/16/2012 0.78 0.60 - 1.30 mg/dL Final   Creatinine, Ser  Date Value Ref Range Status  07/25/2022 0.76 0.57 - 1.00 mg/dL Final         Passed - AST in normal range and within 360 days    AST  Date Value Ref Range Status  07/25/2022 17 0 - 40 IU/L Final   SGOT(AST)  Date Value Ref Range Status  06/16/2012 22 15 - 37 Unit/L Final         Passed - ALT in normal range and within 360 days    ALT  Date Value Ref Range Status  07/25/2022 13 0 - 32 IU/L Final   SGPT (ALT)  Date  Value Ref Range Status  06/16/2012 32 12 - 78 U/L Final         Passed - Completed PHQ-2 or PHQ-9 in the last 360 days      Passed - Last BP in normal range    BP Readings from Last 1 Encounters:  07/25/22 105/71          FLUoxetine (PROZAC) 40 MG capsule [Pharmacy Med Name: FLUOXETINE HCL 40 MG CAPSULE] 90 capsule 1    Sig: TAKE 1 CAPSULE (40 MG TOTAL) BY MOUTH DAILY.     Psychiatry:  Antidepressants - SSRI Failed - 03/19/2023  3:34 PM      Failed - Valid encounter within last 6 months    Recent Outpatient Visits           6 months ago BMI 50.0-59.9, adult [facility]   [place] [facility] at [facility], Ocie Bob, MD   7 months ago Healthcare maintenance   [facility] Medicine at [facility], Ocie Bob, MD  9 months ago Anxiety and depression   [place] [facility] at [facility], Ocie Bob, MD   1 year ago Anxiety and depression   [place] [facility] at [facility], Ocie Bob, MD   1 year ago Anxiety and depression   [facility] & [facility] Medicine at [facility], Ocie Bob, MD              Passed - Completed PHQ-2 or PHQ-9 in the last 360 days

## 2023-06-11 ENCOUNTER — Telehealth: Admitting: Family Medicine

## 2023-06-11 ENCOUNTER — Encounter: Payer: Self-pay | Admitting: Family Medicine

## 2023-06-11 ENCOUNTER — Telehealth: Payer: Self-pay | Admitting: Family Medicine

## 2023-06-11 VITALS — Ht 69.0 in

## 2023-06-11 DIAGNOSIS — F419 Anxiety disorder, unspecified: Secondary | ICD-10-CM | POA: Diagnosis not present

## 2023-06-11 DIAGNOSIS — F32A Depression, unspecified: Secondary | ICD-10-CM | POA: Diagnosis not present

## 2023-06-11 MED ORDER — BUPROPION HCL ER (XL) 300 MG PO TB24: 300.0000 mg | ORAL_TABLET | Freq: Every day | ORAL | 0 refills | Status: DC

## 2023-06-11 MED ORDER — FLUOXETINE HCL 60 MG PO TABS: 1.0000 | ORAL_TABLET | Freq: Every day | ORAL | 0 refills | Status: DC

## 2023-06-11 NOTE — Patient Instructions (Signed)
 Patient Plan  1. Medication Adjustments    - Increase fluoxetine  to 60 mg daily.    - Increase bupropion  to 300 mg daily.  2. Behavioral Therapy    - Contact [facility] at [phone] to schedule therapy sessions.  3. Follow-Up    - Schedule a follow-up appointment in July for reassessment and physical examination.  Red Flags: - If you experience any worsening of symptoms or new side effects, contact your provider immediately.

## 2023-06-11 NOTE — Assessment & Plan Note (Signed)
 History of Present Illness Heather Irwin is a 33 year old female who presents for medication management and therapy referral for depression.  She experienced improvement in her depressive symptoms after the addition of Wellbutrin  (bupropion ) to her Prozac  (fluoxetine ) regimen last year. However, she did not pursue therapy due to not hearing back regarding scheduling. She is interested in resuming therapy, having previously benefited from it when she had better insurance coverage.  Since February, she has experienced an increase in stress and depressive symptoms, with significant worsening over the past month. She attributes her stress to political changes and general life stressors, including parenting and work, but finds it difficult to identify specific triggers. Her sister, who is a therapist, suggested discussing medication adjustments.  She has been taking Prozac  40 mg daily and Wellbutrin . She feels that her current medication regimen is not as effective as before, prompting her to seek medical advice.  Assessment and Plan Anxiety and Depression Symptoms worsened due to stressors. Current treatment includes fluoxetine  and bupropion . Discussed potential medication adjustments and behavioral therapy as long-term solutions. Vraylar considered to replace bupropion  if course suboptimal. - Increase fluoxetine  to 60 mg daily. - Increase bupropion  to 300 mg daily. - Refer to [facility] for therapy and can contact them directly for expedited scheduling: [phone] - Schedule follow-up appointment in July for reassessment and physical examination.

## 2023-06-11 NOTE — Telephone Encounter (Signed)
 Please review patient message.   JM

## 2023-06-11 NOTE — Telephone Encounter (Signed)
 Patient need a appt can be VV.  JM

## 2023-06-11 NOTE — Progress Notes (Signed)
 Primary Care / Sports Medicine Virtual Visit  Patient Information:  Patient ID: Heather Irwin, female DOB: December 10, 1990 Age: 33 y.o. MRN: [id number]   Heather Irwin is a pleasant 33 y.o. female presenting with the following:  Chief Complaint  Patient presents with   anxiety and depression     Wants a increase in medication     Review of Systems: No fevers, chills, night sweats, weight loss, chest pain, or shortness of breath.   Patient Active Problem List   Diagnosis Date Noted   Healthcare maintenance 08/03/2022   BMI 50.0-59.9, adult (HCC) 06/11/2022   Mild episode of recurrent major depressive disorder (HCC) 07/03/2021   Attention and concentration deficit 07/03/2021   Anxiety and depression 04/10/2021   Pain in joint of right knee 04/01/2021   Stiffness of right knee 04/01/2021   Patellofemoral stress syndrome 03/15/2021   Vitamin D  deficiency 02/23/2021   History of depression 11/29/2020   Foreign body of left upper arm 03/10/2019   Past Medical History:  Diagnosis Date   ADHD 02/22/2021   Anxiety    Bronchitis, acute    as a teen only   Depression    " no meds"   Family history of adverse reaction to anesthesia    Mother has HX: of PONV   Family history of ovarian cancer    5/23 cancer genetic testing letter sent   GERD (gastroesophageal reflux disease)    Headache    migraines   Healthcare maintenance 08/03/2022   Morbid obesity (HCC)    Retained foreign body    left humerus   Outpatient Encounter Medications as of 06/11/2023  Medication Sig   FLUoxetine  HCl 60 MG TABS Take 1 tablet by mouth daily.   [DISCONTINUED] buPROPion  (WELLBUTRIN  XL) 150 MG 24 hr tablet TAKE 1 TABLET BY MOUTH EVERY DAY   [DISCONTINUED] FLUoxetine  (PROZAC ) 40 MG capsule TAKE 1 CAPSULE (40 MG TOTAL) BY MOUTH DAILY.   buPROPion  (WELLBUTRIN  XL) 300 MG 24 hr tablet Take 1 tablet (300 mg total) by mouth daily.   No facility-administered encounter medications on file as of  06/11/2023.   Past Surgical History:  Procedure Laterality Date   FOREIGN BODY REMOVAL Left 05/08/2019   Procedure: LEFT HUMERUS FOREIGN BODY REMOVAL;  Surgeon: Wes Hamman, MD;  Location: [facility] OR;  Service: Orthopedics;  Laterality: Left;    Virtual Visit via MyChart Video:   I connected with Heather Irwin on 06/11/23 via MyChart Video and verified that I am speaking with the correct person using appropriate identifiers.   The limitations, risks, security and privacy concerns of performing an evaluation and management service by MyChart Video, including the higher likelihood of inaccurate diagnoses and treatments, and the availability of in person appointments were reviewed. The possible need of an additional face-to-face encounter for complete and high quality delivery of care was discussed. The patient was also made aware that there may be a patient responsible charge related to this service. The patient expressed understanding and wishes to proceed.  Provider location is in medical facility. Patient location is in their parked vehicle, different from provider location. People involved in care of the patient during this telehealth encounter were myself, my nurse/medical assistant, and my front office/scheduling team member.  Objective findings:   General: Speaking full sentences, no audible heavy breathing. Sounds alert and appropriately interactive. Well-appearing. Face symmetric. Extraocular movements intact. Pupils equal and round. No nasal flaring or accessory muscle use visualized.  Independent  interpretation of notes and tests performed by another provider:   None  Pertinent History, Exam, Impression, and Recommendations:   Problem List Items Addressed This Visit     Anxiety and depression   History of Present Illness Heather Irwin is a 33 year old female who presents for medication management and therapy referral for depression.  She experienced improvement in her  depressive symptoms after the addition of Wellbutrin  (bupropion ) to her Prozac  (fluoxetine ) regimen last year. However, she did not pursue therapy due to not hearing back regarding scheduling. She is interested in resuming therapy, having previously benefited from it when she had better insurance coverage.  Since February, she has experienced an increase in stress and depressive symptoms, with significant worsening over the past month. She attributes her stress to political changes and general life stressors, including parenting and work, but finds it difficult to identify specific triggers. Her sister, who is a therapist, suggested discussing medication adjustments.  She has been taking Prozac  40 mg daily and Wellbutrin . She feels that her current medication regimen is not as effective as before, prompting her to seek medical advice.  Assessment and Plan Anxiety and Depression Symptoms worsened due to stressors. Current treatment includes fluoxetine  and bupropion . Discussed potential medication adjustments and behavioral therapy as long-term solutions. Vraylar considered to replace bupropion  if course suboptimal. - Increase fluoxetine  to 60 mg daily. - Increase bupropion  to 300 mg daily. - Refer to [facility] for therapy and can contact them directly for expedited scheduling: [phone] - Schedule follow-up appointment in July for reassessment and physical examination.      Relevant Medications   FLUoxetine  HCl 60 MG TABS   buPROPion  (WELLBUTRIN  XL) 300 MG 24 hr tablet     Orders & Medications Medications:  Meds ordered this encounter  Medications   FLUoxetine  HCl 60 MG TABS    Sig: Take 1 tablet by mouth daily.    Dispense:  60 tablet    Refill:  0   buPROPion  (WELLBUTRIN  XL) 300 MG 24 hr tablet    Sig: Take 1 tablet (300 mg total) by mouth daily.    Dispense:  60 tablet    Refill:  0   No orders of the defined types were placed in this encounter.    I  discussed the above assessment and treatment plan with the patient. The patient was provided an opportunity to ask questions and all were answered. The patient agreed with the plan and demonstrated an understanding of the instructions.   The patient was advised to call back or seek an in-person evaluation if the symptoms worsen or if the condition fails to improve as anticipated.   I provided a total time of 30 minutes including both face-to-face and non-face-to-face time on 06/11/2023 inclusive of time utilized for medical chart review, information gathering, care coordination with staff, and documentation completion.    Ma Saupe, MD, [facility]   Primary Care Sports Medicine [facility] at [facility]

## 2023-06-13 ENCOUNTER — Other Ambulatory Visit (HOSPITAL_COMMUNITY): Payer: Self-pay

## 2023-06-20 ENCOUNTER — Other Ambulatory Visit (HOSPITAL_COMMUNITY): Payer: Self-pay

## 2023-07-03 NOTE — Telephone Encounter (Signed)
 Erroneous- disregard

## 2023-07-23 ENCOUNTER — Other Ambulatory Visit: Payer: Self-pay

## 2023-07-23 ENCOUNTER — Encounter: Payer: Self-pay | Admitting: Family Medicine

## 2023-07-23 DIAGNOSIS — F419 Anxiety disorder, unspecified: Secondary | ICD-10-CM

## 2023-07-23 MED ORDER — FLUOXETINE HCL 60 MG PO TABS: 1.0000 | ORAL_TABLET | Freq: Every day | ORAL | 0 refills | Status: DC

## 2023-07-24 ENCOUNTER — Telehealth: Payer: Self-pay | Admitting: Pharmacy Technician

## 2023-07-24 ENCOUNTER — Other Ambulatory Visit (HOSPITAL_COMMUNITY): Payer: Self-pay

## 2023-07-24 NOTE — Telephone Encounter (Signed)
 Pharmacy Patient Advocate Encounter   Received notification from Patient Advice Request messages that prior authorization for FLUoxetine  HCl 60MG  tablets is required/requested.   Insurance verification completed.   The patient is insured through [facility] .   Per test claim: PA required and submitted KEY/EOC/Request #: [id number] from 07/24/23 to 07/23/24

## 2023-07-24 NOTE — Telephone Encounter (Signed)
 PA request has been Approved. New Encounter has been or will be created for follow up. For additional info see Pharmacy Prior Auth telephone encounter from 07/24/23.

## 2023-07-24 NOTE — Telephone Encounter (Signed)
 Patient needs PA for fluoxetine  60 mg.  Thank you,  JM

## 2023-08-02 ENCOUNTER — Other Ambulatory Visit: Payer: Self-pay | Admitting: Family Medicine

## 2023-08-02 DIAGNOSIS — F419 Anxiety disorder, unspecified: Secondary | ICD-10-CM

## 2023-08-05 NOTE — Telephone Encounter (Signed)
 Requested Prescriptions  Pending Prescriptions Disp Refills   buPROPion  (WELLBUTRIN  XL) 300 MG 24 hr tablet [Pharmacy Med Name: BUPROPION  HCL XL 300 MG TABLET] 90 tablet 0    Sig: TAKE 1 TABLET BY MOUTH EVERY DAY     Psychiatry: Antidepressants - bupropion  Failed - 08/05/2023  9:55 AM      Failed - Cr in normal range and within 360 days    Creatinine  Date Value Ref Range Status  06/16/2012 0.78 0.60 - 1.30 mg/dL Final   Creatinine, Ser  Date Value Ref Range Status  07/25/2022 0.76 0.57 - 1.00 mg/dL Final         Failed - AST in normal range and within 360 days    AST  Date Value Ref Range Status  07/25/2022 17 0 - 40 IU/L Final   SGOT(AST)  Date Value Ref Range Status  06/16/2012 22 15 - 37 Unit/L Final         Failed - ALT in normal range and within 360 days    ALT  Date Value Ref Range Status  07/25/2022 13 0 - 32 IU/L Final   SGPT (ALT)  Date Value Ref Range Status  06/16/2012 32 12 - 78 U/L Final         Passed - Completed PHQ-2 or PHQ-9 in the last 360 days      Passed - Last BP in normal range    BP Readings from Last 1 Encounters:  07/25/22 105/71         Passed - Valid encounter within last 6 months    Recent Outpatient Visits           1 month ago Anxiety and depression   [place] [facility] at [facility], Selinda PARAS, MD       Future Appointments             In 1 month Alvia, Selinda PARAS, MD [facility] Primary Care & Sports Medicine at [facility], [facility]

## 2023-09-02 ENCOUNTER — Encounter: Payer: Self-pay | Admitting: Family Medicine

## 2023-09-02 NOTE — Telephone Encounter (Signed)
 Please review and advise.  JM

## 2023-09-26 ENCOUNTER — Encounter: Payer: Self-pay | Admitting: Family Medicine

## 2023-09-26 ENCOUNTER — Ambulatory Visit (INDEPENDENT_AMBULATORY_CARE_PROVIDER_SITE_OTHER): Admitting: Family Medicine

## 2023-09-26 VITALS — BP 122/82 | HR 79 | Ht 69.0 in | Wt 368.6 lb

## 2023-09-26 DIAGNOSIS — G5603 Carpal tunnel syndrome, bilateral upper limbs: Secondary | ICD-10-CM | POA: Insufficient documentation

## 2023-09-26 DIAGNOSIS — F32A Depression, unspecified: Secondary | ICD-10-CM | POA: Diagnosis not present

## 2023-09-26 DIAGNOSIS — M62830 Muscle spasm of back: Secondary | ICD-10-CM | POA: Diagnosis not present

## 2023-09-26 DIAGNOSIS — D1722 Benign lipomatous neoplasm of skin and subcutaneous tissue of left arm: Secondary | ICD-10-CM

## 2023-09-26 DIAGNOSIS — F419 Anxiety disorder, unspecified: Secondary | ICD-10-CM

## 2023-09-26 DIAGNOSIS — Z0000 Encounter for general adult medical examination without abnormal findings: Secondary | ICD-10-CM | POA: Diagnosis not present

## 2023-09-26 MED ORDER — CYCLOBENZAPRINE HCL 10 MG PO TABS: 10.0000 mg | ORAL_TABLET | Freq: Every evening | ORAL | 0 refills | Status: AC | PRN

## 2023-09-26 MED ORDER — FLUOXETINE HCL 60 MG PO TABS: 1.0000 | ORAL_TABLET | Freq: Every day | ORAL | 3 refills | Status: AC

## 2023-09-26 MED ORDER — BUPROPION HCL ER (XL) 300 MG PO TB24
300.0000 mg | ORAL_TABLET | Freq: Every day | ORAL | 3 refills | Status: AC
Start: 2023-09-26 — End: ?

## 2023-09-26 MED ORDER — CELECOXIB 100 MG PO CAPS: 100.0000 mg | ORAL_CAPSULE | Freq: Two times a day (BID) | ORAL | 0 refills | Status: AC | PRN

## 2023-09-26 NOTE — Patient Instructions (Addendum)
-   Obtain fasting labs with orders provided (can have water or black coffee but otherwise no food or drink x 8 hours before labs) - Review information provided - Attend eye doctor annually, dentist every 6 months, work towards or maintain 30 minutes of moderate intensity physical activity at least 5 days per week, and consume a balanced diet - Return in 1 year for physical - Contact us  for any questions between now and then   Patient Plan for Post-Visit Guidance  Anxiety and Depression  - Continue current medications as prescribed.  Carpal Tunnel Syndrome, Bilateral  - Wear night splints for wrist support every night; use during the day if needed. - Take Celebrex  100 mg once daily with food for two weeks; a second dose may be taken if needed. - After two weeks, Celebrex  can be taken twice daily as needed. - Begin home exercises as provided in your MyChart message. - Follow up in one month to reassess symptoms and consider further evaluation if limited improvement.  Thoracic Back Pain and Muscle Spasm  - Use cyclobenzaprine  as needed for muscle relaxation, mainly at night. - Begin home exercises as provided in your MyChart message. - Use ergonomic recommendations at your workplace.  Lipoma of Left Shoulder/Arm  - Monitor the lipoma for changes in size, texture, or new symptoms. Contact the office if any changes occur.  Healthcare Maintenance  - Annual exam completed; follow up on lab results when available.  Red Flags  - Seek medical attention if you experience severe or sudden numbness, weakness, loss of hand function, severe back pain, chest pain, shortness of breath, or any new or worsening symptoms.

## 2023-09-26 NOTE — Assessment & Plan Note (Signed)
 Annual examination completed, risk stratification labs ordered, anticipatory guidance provided.  We will follow labs once resulted.

## 2023-09-26 NOTE — Progress Notes (Signed)
 Primary Care / Sports Medicine Office Visit  Patient Information:  Patient ID: Heather Irwin, female DOB: Nov 09, 1990 Age: 33 y.o. MRN: [id number]   Heather Irwin is a pleasant 33 y.o. female presenting with the following:  Chief Complaint  Patient presents with   Annual Exam    Patient presents today for her annual exam. She is doing well and has no concerns for today's visit.    Vitals:   09/26/23 0901  BP: 122/82  Pulse: 79  SpO2: 97%   Vitals:   09/26/23 0901  Weight: (!) 368 lb 9.6 oz (167.2 kg)  Height: 5' 9 (1.753 m)   Body mass index is 54.43 kg/m.  No results found.   Independent interpretation of notes and tests performed by another provider:   None  Procedures performed:   None  Pertinent History, Exam, Impression, and Recommendations:   Problem List Items Addressed This Visit     Anxiety and depression   Behavioral health Mood scores improved, GAD score 4, PHQ score 8. Current treatment effective and well-tolerated. - Continue current treatment regimen.      Relevant Medications   FLUoxetine  HCl 60 MG TABS   buPROPion  (WELLBUTRIN  XL) 300 MG 24 hr tablet   Carpal tunnel syndrome, bilateral   Bilateral upper extremity paresthesia - Numbness and tingling in both arms present for the last ten years - Symptoms occur daily, especially with prolonged positioning - Tingling extends from the fingertips to just past the elbows bilaterally - Numbness most prominent in the middle fingers of both hands - No numbness or tingling extending beyond the elbows - No new changes in symptoms - No medications taken for these symptoms  Bilateral carpal tunnel syndrome Positive Phalen's test with numbness and tingling in middle fingers bilaterally. Negative Spurling's test, however does report chronic mid back pain which does raise concern for underlying cervicothoracic etiology as well. - Prescribed night splints for wrist support at night.  Wear  consistently until your follow-up.  Can use these splints during the daytime as well if symptoms dictate. - Prescribed Celebrex  once daily with food for two weeks, optional second dose if needed. - After 2 weeks can dose Celebrex  twice daily with both doses being on an as-needed basis. - Begin home exercises as provided in your MyChart message. - Scheduled follow-up in one month to reassess symptoms and consider cervical spine investigation if limited improvement noted.      Relevant Medications   celecoxib  (CELEBREX ) 100 MG capsule   cyclobenzaprine  (FLEXERIL ) 10 MG tablet   FLUoxetine  HCl 60 MG TABS   buPROPion  (WELLBUTRIN  XL) 300 MG 24 hr tablet   Healthcare maintenance - Primary   Annual examination completed, risk stratification labs ordered, anticipatory guidance provided.  We will follow labs once resulted.      Relevant Orders   CBC with Differential/Platelet   Comprehensive metabolic panel with GFR   Lipid panel   TSH   Hemoglobin A1c   Lipoma of left shoulder   Benign lipoma of left arm Asymptomatic slowly growing lipoma with no signs of malignancy. - Monitor for changes in size, texture, or symptoms. Contact us  if this is the case.      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  If recalcitrant pain noted, cervical spine evaluation to be conducted. - Provided ergonomic evaluation note for workplace  adjustments. - Prescribed cyclobenzaprine  as needed for muscle relaxation, primarily at night.  Side effect is drowsiness. - Start home exercises with information provided (see separate MyChart message)      Relevant Medications   cyclobenzaprine  (FLEXERIL ) 10 MG tablet     Orders & Medications Medications:  Meds ordered this encounter  Medications   celecoxib  (CELEBREX ) 100 MG capsule    Sig: Take 1 capsule (100 mg total) by mouth 2 (two) times daily as needed. TAKE WITH FOOD    Dispense:  60 capsule    Refill:  0   cyclobenzaprine  (FLEXERIL ) 10 MG tablet    Sig: Take 1 tablet (10 mg total) by mouth at bedtime as needed for muscle spasms.    Dispense:  30 tablet    Refill:  0   FLUoxetine  HCl 60 MG TABS    Sig: Take 1 tablet by mouth daily.    Dispense:  90 tablet    Refill:  3   buPROPion  (WELLBUTRIN  XL) 300 MG 24 hr tablet    Sig: Take 1 tablet (300 mg total) by mouth daily.    Dispense:  90 tablet    Refill:  3   Orders Placed This Encounter  Procedures   CBC with Differential/Platelet   Comprehensive metabolic panel with GFR   Lipid panel   TSH   Hemoglobin A1c     Return in about 4 weeks (around 10/24/2023) for f/u Carpal tunnel.     Heather JINNY Ku, MD, [facility]   Primary Care Sports Medicine Primary Care and Sports Medicine at [facility]

## 2023-09-26 NOTE — Assessment & Plan Note (Signed)
 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  If recalcitrant pain noted, cervical spine evaluation to be conducted. - Provided ergonomic evaluation note for workplace adjustments. - Prescribed cyclobenzaprine  as needed for muscle relaxation, primarily at night.  Side effect is drowsiness. - Start home exercises with information provided (see separate MyChart message)

## 2023-09-26 NOTE — Assessment & Plan Note (Addendum)
 Bilateral upper extremity paresthesia - Numbness and tingling in both arms present for the last ten years - Symptoms occur daily, especially with prolonged positioning - Tingling extends from the fingertips to just past the elbows bilaterally - Numbness most prominent in the middle fingers of both hands - No numbness or tingling extending beyond the elbows - No new changes in symptoms - No medications taken for these symptoms  Bilateral carpal tunnel syndrome Positive Phalen's test with numbness and tingling in middle fingers bilaterally. Negative Spurling's test, however does report chronic mid back pain which does raise concern for underlying cervicothoracic etiology as well. - Prescribed night splints for wrist support at night.  Wear consistently until your follow-up.  Can use these splints during the daytime as well if symptoms dictate. - Prescribed Celebrex  once daily with food for two weeks, optional second dose if needed. - After 2 weeks can dose Celebrex  twice daily with both doses being on an as-needed basis. - Begin home exercises as provided in your MyChart message. - Scheduled follow-up in one month to reassess symptoms and consider cervical spine investigation if limited improvement noted.

## 2023-09-26 NOTE — Assessment & Plan Note (Signed)
 Benign lipoma of left arm Asymptomatic slowly growing lipoma with no signs of malignancy. - Monitor for changes in size, texture, or symptoms. Contact us  if this is the case.

## 2023-09-26 NOTE — Assessment & Plan Note (Signed)
 Behavioral health Mood scores improved, GAD score 4, PHQ score 8. Current treatment effective and well-tolerated. - Continue current treatment regimen.

## 2023-09-27 ENCOUNTER — Ambulatory Visit: Payer: Self-pay | Admitting: Family Medicine

## 2023-09-27 LAB — COMPREHENSIVE METABOLIC PANEL WITH GFR
ALT: 22 IU/L (ref 0–32)
AST: 22 IU/L (ref 0–40)
Albumin: 4.3 g/dL (ref 3.9–4.9)
Alkaline Phosphatase: 101 IU/L (ref 44–121)
BUN/Creatinine Ratio: 11 (ref 9–23)
BUN: 10 mg/dL (ref 6–20)
Bilirubin Total: 0.3 mg/dL (ref 0.0–1.2)
CO2: 22 mmol/L (ref 20–29)
Calcium: 9.3 mg/dL (ref 8.7–10.2)
Chloride: 102 mmol/L (ref 96–106)
Creatinine, Ser: 0.87 mg/dL (ref 0.57–1.00)
Globulin, Total: 3.1 g/dL (ref 1.5–4.5)
Glucose: 79 mg/dL (ref 70–99)
Potassium: 4.7 mmol/L (ref 3.5–5.2)
Sodium: 139 mmol/L (ref 134–144)
Total Protein: 7.4 g/dL (ref 6.0–8.5)
eGFR: 90 mL/min/1.73 (ref >59)

## 2023-09-27 LAB — CBC WITH DIFFERENTIAL/PLATELET
Basophils Absolute: 0 x10E3/uL (ref 0.0–0.2)
Basos: 0 %
EOS (ABSOLUTE): 0.1 x10E3/uL (ref 0.0–0.4)
Eos: 1 %
Hematocrit: 41.2 % (ref 34.0–46.6)
Hemoglobin: 13.1 g/dL (ref 11.1–15.9)
Immature Grans (Abs): 0.1 x10E3/uL (ref 0.0–0.1)
Immature Granulocytes: 1 %
Lymphocytes Absolute: 2.2 x10E3/uL (ref 0.7–3.1)
Lymphs: 21 %
MCH: 28.6 pg (ref 26.6–33.0)
MCHC: 31.8 g/dL (ref 31.5–35.7)
MCV: 90 fL (ref 79–97)
Monocytes Absolute: 0.7 x10E3/uL (ref 0.1–0.9)
Monocytes: 7 %
Neutrophils Absolute: 7.5 x10E3/uL — ABNORMAL HIGH (ref 1.4–7.0)
Neutrophils: 70 %
Platelets: 319 x10E3/uL (ref 150–450)
RBC: 4.58 x10E6/uL (ref 3.77–5.28)
RDW: 13.2 % (ref 11.7–15.4)
WBC: 10.6 x10E3/uL (ref 3.4–10.8)

## 2023-09-27 LAB — TSH: TSH: 2.82 u[IU]/mL (ref 0.450–4.500)

## 2023-09-27 LAB — LIPID PANEL
Chol/HDL Ratio: 4.1 ratio (ref 0.0–4.4)
Cholesterol, Total: 193 mg/dL (ref 100–199)
HDL: 47 mg/dL (ref >39)
LDL Chol Calc (NIH): 109 mg/dL — ABNORMAL HIGH (ref 0–99)
Triglycerides: 214 mg/dL — ABNORMAL HIGH (ref 0–149)
VLDL Cholesterol Cal: 37 mg/dL (ref 5–40)

## 2023-09-27 LAB — HEMOGLOBIN A1C
Est. average glucose Bld gHb Est-mCnc: 108 mg/dL
Hgb A1c MFr Bld: 5.4 % (ref 4.8–5.6)

## 2023-10-25 ENCOUNTER — Ambulatory Visit: Admitting: Family Medicine

## 2023-10-29 ENCOUNTER — Ambulatory Visit (INDEPENDENT_AMBULATORY_CARE_PROVIDER_SITE_OTHER): Admitting: Family Medicine

## 2023-10-29 ENCOUNTER — Encounter: Payer: Self-pay | Admitting: Family Medicine

## 2023-10-29 VITALS — BP 124/72 | HR 89 | Ht 69.0 in | Wt 365.4 lb

## 2023-10-29 DIAGNOSIS — G5603 Carpal tunnel syndrome, bilateral upper limbs: Secondary | ICD-10-CM

## 2023-10-29 MED ORDER — MELOXICAM 15 MG PO TABS: 15.0000 mg | ORAL_TABLET | Freq: Every day | ORAL | 0 refills | Status: AC

## 2023-10-29 NOTE — Progress Notes (Signed)
     Primary Care / Sports Medicine Office Visit  Patient Information:  Patient ID: Heather Irwin, female DOB: 28-Dec-1990 Age: 33 y.o. MRN: [id number]   Heather Irwin is a pleasant 34 y.o. female presenting with the following:  Chief Complaint  Patient presents with   Wrist Pain    Patient presents today for bil wrist pain. Patient has been wearing braces nightly which haven't been to helpful. She would like to discuss alternative treatments. No xray's.    Vitals:   10/29/23 0833  BP: 124/72  Pulse: 89  SpO2: 97%   Vitals:   10/29/23 0833  Weight: (!) 365 lb 6.4 oz (165.7 kg)  Height: 5' 9 (1.753 m)   Body mass index is 53.96 kg/m.  No results found.   Discussed the use of AI scribe software for clinical note transcription with the patient, who gave verbal consent to proceed.   Independent interpretation of notes and tests performed by another provider:   None  Procedures performed:   None  Pertinent History, Exam, Impression, and Recommendations:   Problem List Items Addressed This Visit     Carpal tunnel syndrome, bilateral - Primary   History of Present Illness Heather Irwin is a 33 year old female who presents for follow-up on her carpal tunnel syndrome symptoms.  Paresthesia and numbness of the hand - Discomfort and tingling in the hand for the past month, particularly when the wrist is bent - Symptoms exacerbated by using a mouse at work, driving long distances, and holding a phone - Hand feels tight and sometimes goes numb, especially during these activities  Functional impact and activity limitations - Symptoms interfere with daily activities, including work and driving  Response to conservative management - Brace found to be too tight, causing circulation issues, resulting in inconsistent use - Anti-inflammatory medication taken once daily, but perceived as ineffective  Assessment and Plan Carpal tunnel syndrome of bilateral  wrists Persistent symptoms due to partial adherence to treatment. Moderate symptoms not worsening. - Provide correct wrist brace and return forearm brace. - Prescribe meloxicam once daily for one month.  Take with food. - Instruct to perform prescribed exercises regularly. - Advise consistent use of meloxicam for at least one week, continue if symptoms persist. - Discuss potential for cortisone injection if symptoms do not improve. - Consider formal physical therapy if symptoms persist despite home exercises and medication. - Surgical options reviewed, patient wishing to proceed in nonsurgical manner.      Relevant Medications   meloxicam (MOBIC) 15 MG tablet     Orders & Medications Medications:  Meds ordered this encounter  Medications   meloxicam (MOBIC) 15 MG tablet    Sig: Take 1 tablet (15 mg total) by mouth daily. X 1-2 weeks then daily PRN. Take with food.    Dispense:  30 tablet    Refill:  0   No orders of the defined types were placed in this encounter.    No follow-ups on file.     Selinda JINNY Ku, MD, [facility]   Primary Care Sports Medicine Primary Care and Sports Medicine at [facility]

## 2023-10-29 NOTE — Assessment & Plan Note (Signed)
 History of Present Illness Heather Irwin is a 33 year old female who presents for follow-up on her carpal tunnel syndrome symptoms.  Paresthesia and numbness of the hand - Discomfort and tingling in the hand for the past month, particularly when the wrist is bent - Symptoms exacerbated by using a mouse at work, driving long distances, and holding a phone - Hand feels tight and sometimes goes numb, especially during these activities  Functional impact and activity limitations - Symptoms interfere with daily activities, including work and driving  Response to conservative management - Brace found to be too tight, causing circulation issues, resulting in inconsistent use - Anti-inflammatory medication taken once daily, but perceived as ineffective  Assessment and Plan Carpal tunnel syndrome of bilateral wrists Persistent symptoms due to partial adherence to treatment. Moderate symptoms not worsening. - Provide correct wrist brace and return forearm brace. - Prescribe meloxicam once daily for one month.  Take with food. - Instruct to perform prescribed exercises regularly. - Advise consistent use of meloxicam for at least one week, continue if symptoms persist. - Discuss potential for cortisone injection if symptoms do not improve. - Consider formal physical therapy if symptoms persist despite home exercises and medication. - Surgical options reviewed, patient wishing to proceed in nonsurgical manner.

## 2023-10-29 NOTE — Patient Instructions (Signed)
 VISIT SUMMARY:  Today, we discussed your ongoing symptoms of carpal tunnel syndrome in your right wrist. You reported discomfort and tingling in your hand, especially during certain activities, and difficulty with daily tasks. We reviewed your current treatment and made some adjustments to help manage your symptoms more effectively.  YOUR PLAN:  CARPAL TUNNEL SYNDROME OF THE WRISTS: You have been experiencing discomfort and tingling in your hands, which is affecting your daily activities. Your symptoms are moderate and have not worsened. -We will provide you with a correct wrist brace and return the forearm brace. -You are prescribed meloxicam to take once daily for one-two weeks then once daily as-needed for symptoms. -Please perform the prescribed exercises regularly. -If your symptoms do not improve, we may consider a cortisone injection. -If symptoms persist despite home exercises and medication, we may consider formal physical therapy.

## 2023-11-04 ENCOUNTER — Encounter: Payer: Self-pay | Admitting: Family Medicine

## 2023-12-20 ENCOUNTER — Other Ambulatory Visit: Payer: Self-pay | Admitting: Family Medicine

## 2023-12-20 DIAGNOSIS — F32A Depression, unspecified: Secondary | ICD-10-CM

## 2023-12-24 NOTE — Telephone Encounter (Signed)
 Requested medication (s) are due for refill today: yes  Requested medication (s) are on the active medication list: yes  Last refill:  09/26/23  Future visit scheduled: yes  Notes to clinic:    Pharmacy comment: Alternative Requested:NEW INS NEEDS A PA FOR DRUG, PRESCRIBER TO CALL INSURANCE.       Requested Prescriptions  Pending Prescriptions Disp Refills   FLUoxetine  HCl 60 MG TABS [Pharmacy Med Name: FLUOXETINE  HCL 60 MG TABLET] 90 tablet 3    Sig: TAKE 1 TABLET BY MOUTH EVERY DAY     Psychiatry:  Antidepressants - SSRI Passed - 12/24/2023  4:00 PM      Passed - Completed PHQ-2 or PHQ-9 in the last 360 days      Passed - Valid encounter within last 6 months    Recent Outpatient Visits           1 month ago Carpal tunnel syndrome, bilateral   [facility], Selinda PARAS, MD   2 months ago Healthcare maintenance   [facility] at [place], Selinda PARAS, MD   6 months ago Anxiety and depression   [facility] at [facility], Selinda PARAS, MD       Future Appointments             In 9 months Ku, Selinda PARAS, MD [facility] Medicine at [facility], [phone] [place]

## 2023-12-24 NOTE — Telephone Encounter (Signed)
 Please complete PA.    KP

## 2023-12-25 ENCOUNTER — Telehealth: Payer: Self-pay | Admitting: Pharmacy Technician

## 2023-12-25 ENCOUNTER — Other Ambulatory Visit (HOSPITAL_COMMUNITY): Payer: Self-pay

## 2023-12-25 NOTE — Telephone Encounter (Signed)
 This was previously approved until 07/2024 on her old insurance, her new insurance will not cover**

## 2023-12-25 NOTE — Telephone Encounter (Signed)
 PA request has been Received. New Encounter has been or will be created for follow up. For additional info see Pharmacy Prior Auth telephone encounter from 12/25/23.

## 2023-12-25 NOTE — Telephone Encounter (Signed)
 Pharmacy Patient Advocate Encounter   Received notification from RX Request Messages that prior authorization for Fluoxetine  HCI 60 mg tabs is required/requested.   Insurance verification completed.   The patient is insured through [facility].   Per test claim: Per test claim, medication is not covered due to plan/benefit exclusion, PA not submitted at this time     **This was previously approved until 07/2024 on her old insurance, her new insurance will not cover**

## 2023-12-25 NOTE — Telephone Encounter (Signed)
Sent pt message on Mychart.  KP

## 2024-09-29 ENCOUNTER — Encounter: Admitting: Family Medicine
